# Patient Record
Sex: Male | Born: 1935 | Race: White | Hispanic: No | Marital: Married | Smoking: Never smoker
Health system: Southern US, Community
[De-identification: ages and names within clinical notes are randomized; demographics above are authoritative.]

## PROBLEM LIST (undated history)

## (undated) DIAGNOSIS — I10 Essential (primary) hypertension: Secondary | ICD-10-CM

## (undated) DIAGNOSIS — F329 Major depressive disorder, single episode, unspecified: Secondary | ICD-10-CM

## (undated) DIAGNOSIS — K529 Noninfective gastroenteritis and colitis, unspecified: Secondary | ICD-10-CM

## (undated) DIAGNOSIS — E785 Hyperlipidemia, unspecified: Secondary | ICD-10-CM

## (undated) DIAGNOSIS — I2510 Atherosclerotic heart disease of native coronary artery without angina pectoris: Secondary | ICD-10-CM

## (undated) DIAGNOSIS — N281 Cyst of kidney, acquired: Secondary | ICD-10-CM

## (undated) DIAGNOSIS — I2699 Other pulmonary embolism without acute cor pulmonale: Secondary | ICD-10-CM

## (undated) DIAGNOSIS — C61 Malignant neoplasm of prostate: Secondary | ICD-10-CM

## (undated) DIAGNOSIS — Z0000 Encounter for general adult medical examination without abnormal findings: Principal | ICD-10-CM

## (undated) DIAGNOSIS — F32A Depression, unspecified: Secondary | ICD-10-CM

## (undated) DIAGNOSIS — I639 Cerebral infarction, unspecified: Secondary | ICD-10-CM

## (undated) DIAGNOSIS — D649 Anemia, unspecified: Secondary | ICD-10-CM

## (undated) DIAGNOSIS — F0390 Unspecified dementia without behavioral disturbance: Secondary | ICD-10-CM

## (undated) HISTORY — DX: Noninfective gastroenteritis and colitis, unspecified: K52.9

## (undated) HISTORY — DX: Essential (primary) hypertension: I10

## (undated) HISTORY — PX: CORONARY ARTERY BYPASS GRAFT: SHX141

## (undated) HISTORY — PX: TONSILLECTOMY: SUR1361

## (undated) HISTORY — PX: EXTERNAL EAR SURGERY: SHX627

## (undated) HISTORY — DX: Other pulmonary embolism without acute cor pulmonale: I26.99

## (undated) HISTORY — DX: Major depressive disorder, single episode, unspecified: F32.9

## (undated) HISTORY — DX: Anemia, unspecified: D64.9

## (undated) HISTORY — DX: Cyst of kidney, acquired: N28.1

## (undated) HISTORY — DX: Atherosclerotic heart disease of native coronary artery without angina pectoris: I25.10

## (undated) HISTORY — DX: Cerebral infarction, unspecified: I63.9

## (undated) HISTORY — DX: Encounter for general adult medical examination without abnormal findings: Z00.00

## (undated) HISTORY — DX: Hyperlipidemia, unspecified: E78.5

## (undated) HISTORY — DX: Unspecified dementia, unspecified severity, without behavioral disturbance, psychotic disturbance, mood disturbance, and anxiety: F03.90

## (undated) HISTORY — DX: Depression, unspecified: F32.A

---

## 2012-08-01 ENCOUNTER — Telehealth: Payer: Self-pay | Admitting: Internal Medicine

## 2012-08-01 NOTE — Telephone Encounter (Signed)
Received medical records from [facility] ° °P: [phone] °F: [phone] °

## 2012-08-07 ENCOUNTER — Encounter: Payer: Self-pay | Admitting: Cardiology

## 2012-08-07 ENCOUNTER — Ambulatory Visit (INDEPENDENT_AMBULATORY_CARE_PROVIDER_SITE_OTHER): Payer: Medicare Other | Admitting: Cardiology

## 2012-08-07 VITALS — BP 140/80 | HR 62 | Wt 176.0 lb

## 2012-08-07 DIAGNOSIS — I10 Essential (primary) hypertension: Secondary | ICD-10-CM | POA: Insufficient documentation

## 2012-08-07 DIAGNOSIS — I25810 Atherosclerosis of coronary artery bypass graft(s) without angina pectoris: Secondary | ICD-10-CM

## 2012-08-07 DIAGNOSIS — R079 Chest pain, unspecified: Secondary | ICD-10-CM

## 2012-08-07 DIAGNOSIS — E785 Hyperlipidemia, unspecified: Secondary | ICD-10-CM | POA: Insufficient documentation

## 2012-08-07 NOTE — Assessment & Plan Note (Signed)
Continue statin. 

## 2012-08-07 NOTE — Progress Notes (Signed)
HPI: pleasant 78 year old male for evaluation of coronary disease. He is status post coronary artery bypassing graft in [place] in 1994. He has been followed there since that time. He recently moved to this area to be close to his daughter. He now presents to establish. He has dyspnea with more extreme activities but not routine activities. No orthopnea, PND or pedal edema. He has had rare brief chest pain in the past but does not have exertional chest pain. No syncope. No claudication.  Current Outpatient Prescriptions  Medication Sig Dispense Refill  . acetaminophen (TYLENOL EX ST ARTHRITIS PAIN) 500 MG tablet Take 500 mg by mouth every 6 (six) hours as needed.      . Ascorbic Acid (VITAMIN C PO) Take 1 tablet by mouth daily.      Marland Kitchen aspirin 81 MG tablet Take 81 mg by mouth daily.      . carvedilol (COREG) 6.25 MG tablet Take 6.25 mg by mouth 2 (two) times daily with a meal.      . Cholecalciferol (VITAMIN D) 400 UNITS capsule Take 400 Units by mouth daily.      . Cholecalciferol (VITAMIN D-3 PO) Take 1 tablet by mouth daily.      . Cyanocobalamin (VITAMIN B-12 CR PO) Take 1 tablet by mouth daily.      . finasteride (PROSCAR) 5 MG tablet Take 5 mg by mouth daily.      . isosorbide mononitrate (IMDUR) 30 MG 24 hr tablet Take 30 mg by mouth daily.      . memantine (NAMENDA) 10 MG tablet Take 10 mg by mouth 2 (two) times daily.      . Misc Natural Products (OSTEO BI-FLEX JOINT SHIELD PO) Take 1 tablet by mouth daily.      . Multiple Vitamin (MULTIVITAMIN) capsule Take 1 capsule by mouth daily.      . NON FORMULARY CALCIUM,MAG,VIT B6,VIT D  1 TAB PO QD      . Omega-3 Fatty Acids (FP FISH OIL PO) Take 1 tablet by mouth daily.      . rivastigmine (EXELON) 9.5 mg/24hr Place 1 patch onto the skin daily.      . simvastatin (ZOCOR) 40 MG tablet Take 40 mg by mouth every evening.      Marland Kitchen VITAMIN E PO Take 1 tablet by mouth daily.        No Known Allergies  Past Medical History  Diagnosis  Date  . Hypertension   . Hyperlipidemia   . CAD (coronary artery disease)   . Pulmonary embolus     Time of CABG  . Dementia   . Depression     Past Surgical History  Procedure Date  . Coronary artery bypass graft     [place]  . External ear surgery   . Tonsillectomy     History   Social History  . Marital Status: Married    Spouse Name: N/A    Number of Children: 1  . Years of Education: N/A   Occupational History  .      Retired   Social History Main Topics  . Smoking status: Never Smoker   . Smokeless tobacco: Not on file  . Alcohol Use: Not on file  . Drug Use: Not on file  . Sexually Active: Not on file   Other Topics Concern  . Not on file   Social History Narrative  . No narrative on file    Family History  Problem Relation Age of  Onset  . Coronary artery disease Father     MI in his 14s    ROS: problems with arthritis, decreased memory and deafness in right ear but no fevers or chills, productive cough, hemoptysis, dysphasia, odynophagia, melena, hematochezia, dysuria, hematuria, rash, seizure activity, orthopnea, PND, pedal edema, claudication. Remaining systems are negative.  Physical Exam:   Blood pressure 140/80, pulse 62, weight 176 lb (79.833 kg).  General:  Well developed/well nourished in NAD Skin warm/dry Patient not depressed No peripheral clubbing Back-normal HEENT-normal/normal eyelids Neck supple/normal carotid upstroke bilaterally; no bruits; no JVD; no thyromegaly chest - CTA/ normal expansion, previous sternotomy CV - RRR/normal S1 and S2; no rubs or gallops;  PMI nondisplaced, 1/6 systolic murmur left sternal border Abdomen -NT/ND, no HSM, no mass, + bowel sounds, no bruit 2+ femoral pulses, no bruits Ext-no edema, chords, 2+ DP Neuro-grossly nonfocal  ECG sinus rhythm at a rate of 62. First-degree AV block. Normal axis. No significant ST changes.

## 2012-08-07 NOTE — Assessment & Plan Note (Signed)
Blood pressure controlled. Continue present medications. 

## 2012-08-07 NOTE — Assessment & Plan Note (Signed)
Plan continue aspirin and statin. I will obtain all of his previous records from [place] to review.

## 2012-08-07 NOTE — Patient Instructions (Addendum)
Your physician wants you to follow-up in: 6 MONTHS WITH DR CRENSHAW IN [place] You will receive a reminder letter in the mail two months in advance. If you don't receive a letter, please call our office to schedule the follow-up appointment.  

## 2012-08-13 ENCOUNTER — Emergency Department (HOSPITAL_COMMUNITY): Payer: Medicare Other

## 2012-08-13 ENCOUNTER — Inpatient Hospital Stay (HOSPITAL_COMMUNITY): Payer: Medicare Other

## 2012-08-13 ENCOUNTER — Encounter (HOSPITAL_COMMUNITY): Payer: Self-pay | Admitting: *Deleted

## 2012-08-13 ENCOUNTER — Inpatient Hospital Stay (HOSPITAL_COMMUNITY)
Admission: EM | Admit: 2012-08-13 | Discharge: 2012-08-15 | DRG: 066 | Disposition: A | Discharge status: Home Health | Payer: Medicare Other | Attending: Internal Medicine | Admitting: Internal Medicine

## 2012-08-13 DIAGNOSIS — Z951 Presence of aortocoronary bypass graft: Secondary | ICD-10-CM | POA: Diagnosis present

## 2012-08-13 DIAGNOSIS — I6350 Cerebral infarction due to unspecified occlusion or stenosis of unspecified cerebral artery: Principal | ICD-10-CM

## 2012-08-13 DIAGNOSIS — F3289 Other specified depressive episodes: Secondary | ICD-10-CM | POA: Diagnosis present

## 2012-08-13 DIAGNOSIS — I639 Cerebral infarction, unspecified: Secondary | ICD-10-CM | POA: Diagnosis present

## 2012-08-13 DIAGNOSIS — E785 Hyperlipidemia, unspecified: Secondary | ICD-10-CM | POA: Diagnosis present

## 2012-08-13 DIAGNOSIS — Z7982 Long term (current) use of aspirin: Secondary | ICD-10-CM

## 2012-08-13 DIAGNOSIS — R4701 Aphasia: Secondary | ICD-10-CM | POA: Diagnosis present

## 2012-08-13 DIAGNOSIS — Z86711 Personal history of pulmonary embolism: Secondary | ICD-10-CM

## 2012-08-13 DIAGNOSIS — R4182 Altered mental status, unspecified: Secondary | ICD-10-CM

## 2012-08-13 DIAGNOSIS — I10 Essential (primary) hypertension: Secondary | ICD-10-CM

## 2012-08-13 DIAGNOSIS — I379 Nonrheumatic pulmonary valve disorder, unspecified: Secondary | ICD-10-CM

## 2012-08-13 DIAGNOSIS — Z79899 Other long term (current) drug therapy: Secondary | ICD-10-CM

## 2012-08-13 DIAGNOSIS — I2510 Atherosclerotic heart disease of native coronary artery without angina pectoris: Secondary | ICD-10-CM | POA: Diagnosis present

## 2012-08-13 DIAGNOSIS — F0390 Unspecified dementia without behavioral disturbance: Secondary | ICD-10-CM

## 2012-08-13 DIAGNOSIS — I25810 Atherosclerosis of coronary artery bypass graft(s) without angina pectoris: Secondary | ICD-10-CM

## 2012-08-13 DIAGNOSIS — F329 Major depressive disorder, single episode, unspecified: Secondary | ICD-10-CM | POA: Insufficient documentation

## 2012-08-13 LAB — POCT I-STAT TROPONIN I: Troponin i, poc: 0 ng/mL (ref 0.00–0.08)

## 2012-08-13 LAB — CBC
HCT: 36.4 % — ABNORMAL LOW (ref 39.0–52.0)
MCH: 31.4 pg (ref 26.0–34.0)
MCHC: 34.6 g/dL (ref 30.0–36.0)
MCV: 90.8 fL (ref 78.0–100.0)
Platelets: 196 10*3/uL (ref 150–400)
RDW: 12.4 % (ref 11.5–15.5)
WBC: 5.8 10*3/uL (ref 4.0–10.5)

## 2012-08-13 LAB — CBC WITH DIFFERENTIAL/PLATELET
HCT: 36.6 % — ABNORMAL LOW (ref 39.0–52.0)
Hemoglobin: 12.6 g/dL — ABNORMAL LOW (ref 13.0–17.0)
Lymphocytes Relative: 25 % (ref 12–46)
Lymphs Abs: 1.4 10*3/uL (ref 0.7–4.0)
Monocytes Relative: 8 % (ref 3–12)
Neutro Abs: 3.6 10*3/uL (ref 1.7–7.7)
Neutrophils Relative %: 64 % (ref 43–77)
RBC: 4.01 MIL/uL — ABNORMAL LOW (ref 4.22–5.81)

## 2012-08-13 LAB — URINALYSIS, ROUTINE W REFLEX MICROSCOPIC
Glucose, UA: NEGATIVE mg/dL
Ketones, ur: NEGATIVE mg/dL
Leukocytes, UA: NEGATIVE
Protein, ur: NEGATIVE mg/dL

## 2012-08-13 LAB — COMPREHENSIVE METABOLIC PANEL
Albumin: 4 g/dL (ref 3.5–5.2)
Alkaline Phosphatase: 52 U/L (ref 39–117)
BUN: 11 mg/dL (ref 6–23)
CO2: 31 mEq/L (ref 19–32)
Chloride: 104 mEq/L (ref 96–112)
GFR calc Af Amer: >90 mL/min (ref >90)
GFR calc non Af Amer: 85 mL/min — ABNORMAL LOW (ref >90)
Glucose, Bld: 106 mg/dL — ABNORMAL HIGH (ref 70–99)
Potassium: 4.4 mEq/L (ref 3.5–5.1)
Total Bilirubin: 0.4 mg/dL (ref 0.3–1.2)

## 2012-08-13 LAB — CREATININE, SERUM: GFR calc Af Amer: >90 mL/min (ref >90)

## 2012-08-13 MED ORDER — ASPIRIN 325 MG PO TABS
325.0000 mg | ORAL_TABLET | Freq: Every day | ORAL | Status: DC
  Administered 2012-08-14: 325 mg via ORAL
  Filled 2012-08-13 (×2): qty 1

## 2012-08-13 MED ORDER — CLOPIDOGREL BISULFATE 75 MG PO TABS: 75.0000 mg | ORAL_TABLET | Freq: Every day | ORAL | Status: DC

## 2012-08-13 MED ORDER — ONDANSETRON HCL 4 MG/2ML IJ SOLN: 4.0000 mg | Freq: Four times a day (QID) | INTRAMUSCULAR | Status: DC | PRN

## 2012-08-13 MED ORDER — SENNOSIDES-DOCUSATE SODIUM 8.6-50 MG PO TABS: 1.0000 | ORAL_TABLET | Freq: Every evening | ORAL | Status: DC | PRN

## 2012-08-13 MED ORDER — SIMVASTATIN 40 MG PO TABS
40.0000 mg | ORAL_TABLET | Freq: Every evening | ORAL | Status: DC
  Administered 2012-08-14 – 2012-08-15 (×2): 40 mg via ORAL
  Filled 2012-08-13 (×3): qty 1

## 2012-08-13 MED ORDER — ADULT MULTIVITAMIN W/MINERALS CH
1.0000 | ORAL_TABLET | Freq: Every day | ORAL | Status: DC
  Administered 2012-08-14 – 2012-08-15 (×2): 1 via ORAL
  Filled 2012-08-13 (×3): qty 1

## 2012-08-13 MED ORDER — MEMANTINE HCL 10 MG PO TABS
10.0000 mg | ORAL_TABLET | Freq: Two times a day (BID) | ORAL | Status: DC
  Administered 2012-08-14 – 2012-08-15 (×3): 10 mg via ORAL
  Filled 2012-08-13 (×5): qty 1

## 2012-08-13 MED ORDER — RIVASTIGMINE 9.5 MG/24HR TD PT24
9.5000 mg | MEDICATED_PATCH | Freq: Every day | TRANSDERMAL | Status: DC
  Administered 2012-08-13 – 2012-08-15 (×3): 9.5 mg via TRANSDERMAL
  Filled 2012-08-13 (×3): qty 1

## 2012-08-13 MED ORDER — LABETALOL HCL 5 MG/ML IV SOLN: 10.0000 mg | Freq: Four times a day (QID) | INTRAVENOUS | Status: DC | PRN

## 2012-08-13 MED ORDER — ASPIRIN 300 MG RE SUPP
300.0000 mg | Freq: Every day | RECTAL | Status: DC
  Administered 2012-08-13: 300 mg via RECTAL
  Filled 2012-08-13 (×3): qty 1

## 2012-08-13 MED ORDER — ENOXAPARIN SODIUM 40 MG/0.4ML ~~LOC~~ SOLN
40.0000 mg | SUBCUTANEOUS | Status: DC
  Administered 2012-08-13 – 2012-08-14 (×2): 40 mg via SUBCUTANEOUS
  Filled 2012-08-13 (×3): qty 0.4

## 2012-08-13 MED ORDER — SODIUM CHLORIDE 0.9 % IV SOLN
INTRAVENOUS | Status: DC
  Administered 2012-08-13: 20:00:00 via INTRAVENOUS

## 2012-08-13 MED ORDER — FINASTERIDE 5 MG PO TABS
5.0000 mg | ORAL_TABLET | Freq: Every day | ORAL | Status: DC
  Administered 2012-08-14 – 2012-08-15 (×2): 5 mg via ORAL
  Filled 2012-08-13 (×3): qty 1

## 2012-08-13 MED ORDER — MULTIVITAMINS PO CAPS: 1.0000 | ORAL_CAPSULE | Freq: Every day | ORAL | Status: DC

## 2012-08-13 NOTE — ED Notes (Signed)
Patient lives at independent living facility and started having periods of confusion and expressive aphasia yesterday.  He has left sided facial drooping and weakness per EMS.

## 2012-08-13 NOTE — Progress Notes (Signed)
VASCULAR LAB PRELIMINARY  PRELIMINARY  PRELIMINARY  PRELIMINARY  Carotid duplex completed.    Preliminary report:  Bilateral:  No evidence of hemodynamically significant internal carotid artery stenosis.   Vertebral artery flow is antegrade.     Shanise Balch, RVS 08/13/2012, 3:47 PM

## 2012-08-13 NOTE — ED Provider Notes (Addendum)
History     CSN: [id number]  Arrival date & time 08/13/12  1128   First MD Initiated Contact with Patient 08/13/12 1150      Chief Complaint  Patient presents with  . Stroke Symptoms    (Consider location/radiation/quality/duration/timing/severity/associated sxs/prior treatment) HPI Pt p/w difficult expressing words and confusion starting yesterday per caretaker. Thought to have left facial drooping by EMS. Pt denies HA, CP, SOB, Abd pain, N/V. Has history of dementia.  Past Medical History  Diagnosis Date  . Hypertension   . Hyperlipidemia   . CAD (coronary artery disease)   . Pulmonary embolus     Time of CABG  . Dementia   . Depression     Past Surgical History  Procedure Date  . Coronary artery bypass graft     [place]  . External ear surgery   . Tonsillectomy     Family History  Problem Relation Age of Onset  . Coronary artery disease Father     MI in his 61s    History  Substance Use Topics  . Smoking status: Never Smoker   . Smokeless tobacco: Not on file  . Alcohol Use: No      Review of Systems  Respiratory: Negative for shortness of breath.   Cardiovascular: Negative for chest pain.  Gastrointestinal: Negative for nausea, vomiting and abdominal pain.  Neurological: Positive for speech difficulty. Negative for headaches.    Allergies  Niacin and related and Wellbutrin  Home Medications   Current Outpatient Rx  Name Route Sig Dispense Refill  . ACETAMINOPHEN ER 650 MG PO TBCR Oral Take 1,300 mg by mouth daily.    . ASPIRIN 325 MG PO TABS Oral Take 325 mg by mouth daily.    Marland Kitchen CARVEDILOL 6.25 MG PO TABS Oral Take 6.25 mg by mouth 2 (two) times daily with a meal.    . FINASTERIDE 5 MG PO TABS Oral Take 5 mg by mouth daily.    . ISOSORBIDE MONONITRATE ER 30 MG PO TB24 Oral Take 30 mg by mouth daily.    Marland Kitchen MEMANTINE HCL 10 MG PO TABS Oral Take 10 mg by mouth 2 (two) times daily.    Lanetta Inch BI-FLEX JOINT SHIELD PO Oral Take 2 tablets by  mouth daily.     . MULTIVITAMINS PO CAPS Oral Take 1 capsule by mouth daily.    . FP FISH OIL PO Oral Take 2,400 mg by mouth daily.     Marland Kitchen RIVASTIGMINE 9.5 MG/24HR TD PT24 Transdermal Place 1 patch onto the skin daily.    Marland Kitchen SIMVASTATIN 40 MG PO TABS Oral Take 40 mg by mouth every evening.      BP 154/81  Pulse 58  Temp 98.3 F (36.8 C) (Oral)  Resp 20  SpO2 99%  Physical Exam  Nursing note and vitals reviewed. Constitutional: He appears well-developed and well-nourished. No distress.  HENT:  Head: Normocephalic and atraumatic.  Mouth/Throat: Oropharynx is clear and moist.  Eyes: EOM are normal. Pupils are equal, round, and reactive to light.  Neck: Normal range of motion. Neck supple.  Cardiovascular: Normal rate and regular rhythm.   Pulmonary/Chest: Effort normal and breath sounds normal. No respiratory distress. He has no wheezes. He has no rales.  Abdominal: Soft. Bowel sounds are normal. There is no tenderness. There is no rebound and no guarding.  Musculoskeletal: Normal range of motion. He exhibits no edema and no tenderness.  Neurological: He is alert.  Oriented x 2, 5/5 grip strength bl upper ext. 5/5 bl LE motor, sensation intact, finger to nose intact. Pt has difficulty finishing thoughts before jumping to next sentence. Difficult to understand  Skin: Skin is warm and dry. No rash noted. No erythema.  Psychiatric: He has a normal mood and affect. His behavior is normal.    ED Course  Procedures (including critical care time)  Labs Reviewed  CBC WITH DIFFERENTIAL - Abnormal; Notable for the following:    RBC 4.01 (*)     Hemoglobin 12.6 (*)     HCT 36.6 (*)     All other components within normal limits  COMPREHENSIVE METABOLIC PANEL - Abnormal; Notable for the following:    Glucose, Bld 106 (*)     GFR calc non Af Amer 85 (*)     All other components within normal limits  URINALYSIS, ROUTINE W REFLEX MICROSCOPIC  PROTIME-INR  APTT  POCT I-STAT TROPONIN I    HEMOGLOBIN A1C  LIPID PANEL   Ct Head Wo Contrast  08/13/2012  *RADIOLOGY REPORT*  Clinical Data: Confusion.  CT HEAD WITHOUT CONTRAST  Technique:  Contiguous axial images were obtained from the base of the skull through the vertex without contrast.  Comparison: None.  Findings: There is atrophy and chronic small vessel disease changes.  Physiologic calcifications in the basal ganglia. No acute intracranial abnormality.  Specifically, no hemorrhage, hydrocephalus, mass lesion, acute infarction, or significant intracranial injury.  No acute calvarial abnormality. Visualized paranasal sinuses and mastoids clear.  Orbital soft tissues unremarkable.  Prior old right mastoidectomy.  IMPRESSION: No acute intracranial abnormality.  Atrophy, chronic microvascular disease.   Original Report Authenticated By: Cyndie Chime, M.D.      1. Altered mental status      Date: 08/13/2012  Rate: 57  Rhythm: sinus bradycardia  QRS Axis: normal  Intervals: normal  ST/T Wave abnormalities: normal  Conduction Disutrbances:none  Narrative Interpretation:   Old EKG Reviewed: none available    MDM  Seen by neuro. Think pt likely suffered CVA. Admit for stroke workup. [facility] to admit        Loren Racer, MD 08/13/12 1444  Loren Racer, MD 08/13/12 1450

## 2012-08-13 NOTE — Consult Note (Signed)
[facility] CONSULT NOTE     Reason for Consult: word finding difficulty    HPI:    Samuel Meyers is an 78 y.o. male who lives at a assisted living center and recently moved to [facility].  Patient at baseline has neuro degenerative decline but not formally diagnosed with dementia.  He is on the Exelon patch and Nemenda.  He also has speech difficulty at baseline and works with SLP at the home.  Yesterday it was noted he was having difficulty expressing himself and having paraphasic errors. He was brought to [facility] for further evaluation.   No tPA given -out of window  Past Medical History  Diagnosis Date  . Hypertension   . Hyperlipidemia   . CAD (coronary artery disease)   . Pulmonary embolus     Time of CABG  . Dementia   . Depression     Past Surgical History  Procedure Date  . Coronary artery bypass graft     [place]  . External ear surgery   . Tonsillectomy     Family History  Problem Relation Age of Onset  . Coronary artery disease Father     MI in his 49s    Social History:  reports that he has never smoked. He does not have any smokeless tobacco history on file. He reports that he does not drink alcohol or use illicit drugs.  Allergies  Allergen Reactions  . Niacin And Related Nausea And Vomiting  . Wellbutrin (Bupropion) Hives    Gi upset    Medications:    No current facility-administered medications for this encounter.   Current Outpatient Prescriptions  Medication Sig Dispense Refill  . acetaminophen (TYLENOL) 650 MG CR tablet Take 1,300 mg by mouth daily.      Marland Kitchen aspirin 325 MG tablet Take 325 mg by mouth daily.      . carvedilol (COREG) 6.25 MG tablet Take 6.25 mg by mouth 2 (two) times daily with a meal.      . finasteride (PROSCAR) 5 MG tablet Take 5 mg by mouth daily.      . isosorbide mononitrate (IMDUR) 30 MG 24 hr tablet Take 30 mg by mouth daily.      . memantine (NAMENDA) 10 MG tablet Take 10 mg  by mouth 2 (two) times daily.      . Misc Natural Products (OSTEO BI-FLEX JOINT SHIELD PO) Take 2 tablets by mouth daily.       . Multiple Vitamin (MULTIVITAMIN) capsule Take 1 capsule by mouth daily.      . Omega-3 Fatty Acids (FP FISH OIL PO) Take 2,400 mg by mouth daily.       . rivastigmine (EXELON) 9.5 mg/24hr Place 1 patch onto the skin daily.      . simvastatin (ZOCOR) 40 MG tablet Take 40 mg by mouth every evening.         Review of Systems - General ROS: negative for - chills, fatigue, fever or hot flashes Hematological and Lymphatic ROS: negative for - bruising, fatigue, jaundice or pallor Endocrine ROS: negative for - hair pattern changes, hot flashes, mood swings or skin changes Respiratory ROS: negative for - cough, hemoptysis, orthopnea or wheezing Cardiovascular ROS: negative for - dyspnea on exertion, orthopnea, palpitations or shortness of breath Gastrointestinal ROS: negative for - abdominal pain, appetite loss, blood in stools, diarrhea  or hematemesis Musculoskeletal ROS: negative for - joint pain, joint stiffness, joint swelling or muscle pain Neurological ROS: positive for - confusion and speech problems Dermatological ROS: negative for dry skin, pruritus and rash   Blood pressure 154/81, pulse 58, temperature 98.3 F (36.8 C), temperature source Oral, resp. rate 20, SpO2 99.00%.   Neurologic Examination:   Mental Status: Alert, oriented to place, year and president.  Speech fluent with difficulty word finding, expressing his thoughts and showing paraphasic errors. Able to follow 3 step commands without difficulty.  Cranial Nerves: II-Visual fields grossly intact. III/IV/VI-Extraocular movements intact.  Pupils reactive bilaterally. Ptosis not present. V/VII-Smile symmetric VIII-grossly intact IX/X-normal gag XI-bilateral shoulder shrug XII-midline tongue extension Motor: 5/5 bilaterally with normal tone and bulk--positive right arm drift. No grasp reflex,  positive palmomental Sensory: Pinprick and light touch intact throughout, bilaterally Deep Tendon Reflexes:  Right: Upper Extremity   Left: Upper extremity   biceps (C-5 to C-6) 2/4   biceps (C-5 to C-6) 2/4 tricep (C7) 2/4    triceps (C7) 2/4 Brachioradialis (C6) 2/4  Brachioradialis (C6) 2/4  Lower Extremity Lower Extremity  quadriceps (L-2 to L-4) 2/4   quadriceps (L-2 to L-4) 2/4 Achilles (S1) 2/4   Achilles (S1) 2/4      Plantars:      Right:  downgoing     Left:  Downgoing Cerebellar: Normal finger-to-nose,  normal heel-to-shin test.     No results found for this basename: cbc, bmp, coags, chol, tri, ldl, hga1c    Results for orders placed during the hospital encounter of 08/13/12 (from the past 48 hour(s))  CBC WITH DIFFERENTIAL     Status: Abnormal   Collection Time   08/13/12 12:05 PM      Component Value Range Comment   WBC 5.7  4.0 - 10.5 K/uL    RBC 4.01 (*) 4.22 - 5.81 MIL/uL    Hemoglobin 12.6 (*) 13.0 - 17.0 g/dL    HCT 16.1 (*) 09.6 - 52.0 %    MCV 91.3  78.0 - 100.0 fL    MCH 31.4  26.0 - 34.0 pg    MCHC 34.4  30.0 - 36.0 g/dL    RDW 04.5  40.9 - 81.1 %    Platelets 197  150 - 400 K/uL    Neutrophils Relative 64  43 - 77 %    Neutro Abs 3.6  1.7 - 7.7 K/uL    Lymphocytes Relative 25  12 - 46 %    Lymphs Abs 1.4  0.7 - 4.0 K/uL    Monocytes Relative 8  3 - 12 %    Monocytes Absolute 0.4  0.1 - 1.0 K/uL    Eosinophils Relative 3  0 - 5 %    Eosinophils Absolute 0.2  0.0 - 0.7 K/uL    Basophils Relative 1  0 - 1 %    Basophils Absolute 0.0  0.0 - 0.1 K/uL   COMPREHENSIVE METABOLIC PANEL     Status: Abnormal   Collection Time   08/13/12 12:05 PM      Component Value Range Comment   Sodium 141  135 - 145 mEq/L    Potassium 4.4  3.5 - 5.1 mEq/L    Chloride 104  96 - 112 mEq/L    CO2 31  19 - 32 mEq/L    Glucose, Bld 106 (*) 70 - 99 mg/dL    BUN 11  6 - 23 mg/dL    Creatinine, Ser 9.14  0.50 -  1.35 mg/dL    Calcium 9.7  8.4 - 86.5 mg/dL    Total Protein  7.0  6.0 - 8.3 g/dL    Albumin 4.0  3.5 - 5.2 g/dL    AST 27  0 - 37 U/L    ALT 22  0 - 53 U/L    Alkaline Phosphatase 52  39 - 117 U/L    Total Bilirubin 0.4  0.3 - 1.2 mg/dL    GFR calc non Af Amer 85 (*) >90 mL/min    GFR calc Af Amer >90  >90 mL/min   PROTIME-INR     Status: Normal   Collection Time   08/13/12 12:05 PM      Component Value Range Comment   Prothrombin Time 14.5  11.6 - 15.2 seconds    INR 1.11  0.00 - 1.49   APTT     Status: Normal   Collection Time   08/13/12 12:05 PM      Component Value Range Comment   aPTT 31  24 - 37 seconds   URINALYSIS, ROUTINE W REFLEX MICROSCOPIC     Status: Normal   Collection Time   08/13/12 12:09 PM      Component Value Range Comment   Color, Urine YELLOW  YELLOW    APPearance CLEAR  CLEAR    Specific Gravity, Urine 1.009  1.005 - 1.030    pH 7.0  5.0 - 8.0    Glucose, UA NEGATIVE  NEGATIVE mg/dL    Hgb urine dipstick NEGATIVE  NEGATIVE    Bilirubin Urine NEGATIVE  NEGATIVE    Ketones, ur NEGATIVE  NEGATIVE mg/dL    Protein, ur NEGATIVE  NEGATIVE mg/dL    Urobilinogen, UA 0.2  0.0 - 1.0 mg/dL    Nitrite NEGATIVE  NEGATIVE    Leukocytes, UA NEGATIVE  NEGATIVE MICROSCOPIC NOT DONE ON URINES WITH NEGATIVE PROTEIN, BLOOD, LEUKOCYTES, NITRITE, OR GLUCOSE <1000 mg/dL.  POCT I-STAT TROPONIN I     Status: Normal   Collection Time   08/13/12 12:25 PM      Component Value Range Comment   Troponin i, poc 0.00  0.00 - 0.08 ng/mL    Comment 3              Ct Head Wo Contrast  08/13/2012  *RADIOLOGY REPORT*  Clinical Data: Confusion.  CT HEAD WITHOUT CONTRAST  Technique:  Contiguous axial images were obtained from the base of the skull through the vertex without contrast.  Comparison: None.  Findings: There is atrophy and chronic small vessel disease changes.  Physiologic calcifications in the basal ganglia. No acute intracranial abnormality.  Specifically, no hemorrhage, hydrocephalus, mass lesion, acute infarction, or significant  intracranial injury.  No acute calvarial abnormality. Visualized paranasal sinuses and mastoids clear.  Orbital soft tissues unremarkable.  Prior old right mastoidectomy.  IMPRESSION: No acute intracranial abnormality.  Atrophy, chronic microvascular disease.   Original Report Authenticated By: Cyndie Chime, M.D.      Assessment/Plan:   78 YO male with HTN, Hyperlipidemia, CAD S/P CABG and greenfield filter who presents to ED after family noted increased difficulty expressing himself and increased word substitution. Given exam findings of right arm drift and languadge difficulties patient likely has suffered a left cortical infarct. tPA not given due to delay in arrival.   Recommend:   Plan: 1. HgbA1c, fasting lipid panel 2. MRI, MRA  of the brain without contrast 3. PT consult, OT consult, Speech consult 4. Echocardiogram 5. Carotid dopplers  6. Prophylactic therapy-Antiplatelet med: Plavix - dose 75 mg daily 7. Risk  Factor modification  Felicie Morn PA-C [facility] [phone]  08/13/2012, 2:13 PM  I have seen and evaluated the patient. I have reviewed the above note and made appropriate changes.   Ritta Slot, MD [facility] [phone]

## 2012-08-13 NOTE — Progress Notes (Signed)
Disposition Note  Delrick Dehart, is a 78 y.o. male,   MRN: [id number]  -  DOB - 01-03-1934  Outpatient Primary MD for the patient is Letitia Libra, Ala Dach, MD   Blood pressure 154/81, pulse 58, temperature 98.3 F (36.8 C), temperature source Oral, resp. rate 20, SpO2 99.00%.  Active Problems:  Altered mental status  Dementia  CAD (coronary artery disease) of artery bypass graft  Hyperlipidemia  Hypertension   78 yo male who lives in ALF was found with altered mental status by his helper who comes to check on him.  Per the EDP the patient is unable to give his own history, but per the helper the patient is unable to complete thoughts and is having more difficulty with aphasia and word finding that usual. (He works with speech therapy at home).  Neuro was consulted and Felicie Morn, PA felt that the patient had a left upper extremity drift.  Consequently it was felt appropriate to admit for stroke work up.  I have requested a telemetry bed on the neuro unit.   Algis Downs, PA-C [facility] Pager: [phone]

## 2012-08-13 NOTE — ED Notes (Signed)
Neuro MD at bedside

## 2012-08-13 NOTE — H&P (Signed)
PATIENT DETAILS Name: Samuel Meyers Age: 78 y.o. Sex: male Date of Birth: [birth year] Admit Date: 08/13/2012 [id number] [name], Ala Dach, MD   CHIEF COMPLAINT:  Speech difficulties since yesterday afternoon  HPI: Patient is a 78 year old Caucasian male with a past medical history of hypertension, coronary disease status post CABG in the 1990s, dementia who presents to the hospital for evaluation of speech difficulty. Patient lives in assisted living facility, he recently moved to [facility] to be close to his door, at baseline he does have some amount of speech difficulty, he was scheduled to see a speech therapist today-who found him with more speech difficulty than usual-apparently patient was having difficulty finding words and expressing himself, so he was referred to the ED for further evaluation. Family is present at bedside, per them this started sometime yesterday afternoon. There is no history of headache, nausea vomiting. There is no history of chest pain or shortness of breath. There is a history of abdominal pain. There is a history of vomiting or diarrhea.   ALLERGIES:   Allergies  Allergen Reactions  . Niacin And Related Nausea And Vomiting  . Wellbutrin (Bupropion) Hives    Gi upset    PAST MEDICAL HISTORY: Past Medical History  Diagnosis Date  . Hypertension   . Hyperlipidemia   . CAD (coronary artery disease)   . Pulmonary embolus     Time of CABG  . Dementia   . Depression     PAST SURGICAL HISTORY: Past Surgical History  Procedure Date  . Coronary artery bypass graft     [place]  . External ear surgery   . Tonsillectomy     MEDICATIONS AT HOME: Prior to Admission medications   Medication Sig Start Date End Date Taking? Authorizing Provider  acetaminophen (TYLENOL) 650 MG CR tablet Take 1,300 mg by mouth daily.   Yes Historical Provider, MD  aspirin 325 MG tablet Take 325 mg by mouth daily.   Yes Historical Provider, MD  carvedilol (COREG)  6.25 MG tablet Take 6.25 mg by mouth 2 (two) times daily with a meal.   Yes Historical Provider, MD  finasteride (PROSCAR) 5 MG tablet Take 5 mg by mouth daily.   Yes Historical Provider, MD  isosorbide mononitrate (IMDUR) 30 MG 24 hr tablet Take 30 mg by mouth daily.   Yes Historical Provider, MD  memantine (NAMENDA) 10 MG tablet Take 10 mg by mouth 2 (two) times daily.   Yes Historical Provider, MD  Misc Natural Products (OSTEO BI-FLEX JOINT SHIELD PO) Take 2 tablets by mouth daily.    Yes Historical Provider, MD  Multiple Vitamin (MULTIVITAMIN) capsule Take 1 capsule by mouth daily.   Yes Historical Provider, MD  Omega-3 Fatty Acids (FP FISH OIL PO) Take 2,400 mg by mouth daily.    Yes Historical Provider, MD  rivastigmine (EXELON) 9.5 mg/24hr Place 1 patch onto the skin daily.   Yes Historical Provider, MD  simvastatin (ZOCOR) 40 MG tablet Take 40 mg by mouth every evening.   Yes Historical Provider, MD    FAMILY HISTORY: Family History  Problem Relation Age of Onset  . Coronary artery disease Father     MI in his 82s    SOCIAL HISTORY:  reports that he has never smoked. He does not have any smokeless tobacco history on file. He reports that he does not drink alcohol or use illicit drugs.  REVIEW OF SYSTEMS:  Constitutional:   No  weight loss, night sweats,  Fevers, chills,  fatigue.  HEENT:    No headaches, Difficulty swallowing,Tooth/dental problems,Sore throat,  No sneezing, itching, ear ache, nasal congestion, post nasal drip,   Cardio-vascular: No chest pain,  Orthopnea, PND, swelling in lower extremities, anasarca, dizziness, palpitations  GI:  No heartburn, indigestion, abdominal pain, nausea, vomiting, diarrhea, change in bowel habits, loss of appetite  Resp: No shortness of breath with exertion or at rest.  No excess mucus, no productive cough, No non-productive cough,  No coughing up of blood.No change in color of mucus.No wheezing.No chest wall deformity  Skin:    no rash or lesions.  GU:  no dysuria, change in color of urine, no urgency or frequency.  No flank pain.  Musculoskeletal: No joint pain or swelling.  No decreased range of motion.  No back pain.  Psych: No change in mood or affect. No depression or anxiety.  No memory loss.   PHYSICAL EXAM: Blood pressure 181/121, pulse 58, temperature 98.3 F (36.8 C), temperature source Oral, resp. rate 18, SpO2 98.00%.  General appearance :Awake, mostly alert, not in any distress. Speech not very fluent-has obvious difficulty and wasn't expressing himself.  Not toxic Looking HEENT: Atraumatic and Normocephalic, pupils equally reactive to light and accomodation Neck: supple, no JVD. No cervical lymphadenopathy.  Chest:Good air entry bilaterally, no added sounds  CVS: S1 S2 regular, no murmurs.  Abdomen: Bowel sounds present, Non tender and not distended with no gaurding, rigidity or rebound. Extremities: B/L Lower Ext shows no edema, both legs are warm to touch, with  dorsalis pedis pulses palpable. Neurology: Awake alert, and oriented X 3, CN II-XII intact-except for slight right facial droop., Non focal-with grossly 5 out of 5 strength,sensory exam is grossly intact.  Skin:No Rash Wounds:N/A  LABS ON ADMISSION:   Basename 08/13/12 1205  NA 141  K 4.4  CL 104  CO2 31  GLUCOSE 106*  BUN 11  CREATININE 0.80  CALCIUM 9.7  MG --  PHOS --    Basename 08/13/12 1205  AST 27  ALT 22  ALKPHOS 52  BILITOT 0.4  PROT 7.0  ALBUMIN 4.0   No results found for this basename: LIPASE:2,AMYLASE:2 in the last 72 hours  Basename 08/13/12 1205  WBC 5.7  NEUTROABS 3.6  HGB 12.6*  HCT 36.6*  MCV 91.3  PLT 197   No results found for this basename: CKTOTAL:3,CKMB:3,CKMBINDEX:3,TROPONINI:3 in the last 72 hours No results found for this basename: DDIMER:2 in the last 72 hours No components found with this basename: POCBNP:3   RADIOLOGIC STUDIES ON ADMISSION: Ct Head Wo  Contrast  08/13/2012  *RADIOLOGY REPORT*  Clinical Data: Confusion.  CT HEAD WITHOUT CONTRAST  Technique:  Contiguous axial images were obtained from the base of the skull through the vertex without contrast.  Comparison: None.  Findings: There is atrophy and chronic small vessel disease changes.  Physiologic calcifications in the basal ganglia. No acute intracranial abnormality.  Specifically, no hemorrhage, hydrocephalus, mass lesion, acute infarction, or significant intracranial injury.  No acute calvarial abnormality. Visualized paranasal sinuses and mastoids clear.  Orbital soft tissues unremarkable.  Prior old right mastoidectomy.  IMPRESSION: No acute intracranial abnormality.  Atrophy, chronic microvascular disease.   Original Report Authenticated By: Cyndie Chime, M.D.     ASSESSMENT AND PLAN: Present on Admission:  .CVA (cerebral infarction)-subacute -Admit to telemetry  -Per RN-patient failed a bedside swallow screen, hence will need a formal speech therapy evaluation. He is already on aspirin, he would likely need to be switched to  oral Plavix. However since he failed a bedside swallow screen he will be n.p.o., so for the time being he will be placed on aspirin currently.  -MRI brain has already been done-results pending  -Will order MRA brain, carotid Doppler and a 2-D echocardiogram  -Continue statin when oral intake resumed.  -Follow neurology recommendation -Need aggressive risk factor modification  .CAD (coronary artery disease) of artery bypass graft -Continue aspirin for now  -Resume Coreg and statin when oral intake resumed.   .Hyperlipidemia -Check lipid panel  -Continue statin when oral intake resumed   .Dementia -Seems to be at baseline -Resume oral medications when oral intake resumed  .Hypertension -Allow for some permissive hypertension, as needed labetalol.   Further plan will depend as patient's clinical course evolves and further radiologic and laboratory  data become available. Patient will be monitored closely.  DVT Prophylaxis: -Prophylactic Lovenox  Code Status: -Full code  Total time spent for admission equals 45 minutes.  [facility] Pager [phone]  If 7PM-7AM, please contact night-coverage [email or web address] Password [facility] 08/13/2012, 4:48 PM

## 2012-08-13 NOTE — ED Notes (Signed)
Per wife and daughter pt has history of dementia like symptoms but has never been diagnosed with dementia.  Pt was normal yesterday until 8pm wife noticed his speech was slurred and wasn't making sense.  Wife did not report because of previous confusion issues.  Today pt was attempting to read and wasn't able to comprehend the words he was reading.  Pt speech is slurred and not able to use the words he wants to use.  Pt appears to understand spoken language and follows commands appropriately.  Pt alert and oriented to person place time and circumstance.  Pt believes he is better than he was yesterday but family disagrees.  Family states the symptoms dont seem to be getting better or worse

## 2012-08-13 NOTE — Progress Notes (Signed)
  Echocardiogram 2D Echocardiogram has been performed.  Cathie Beams 08/13/2012, 3:59 PM

## 2012-08-14 DIAGNOSIS — I10 Essential (primary) hypertension: Secondary | ICD-10-CM

## 2012-08-14 DIAGNOSIS — I2510 Atherosclerotic heart disease of native coronary artery without angina pectoris: Secondary | ICD-10-CM

## 2012-08-14 LAB — HEMOGLOBIN A1C
Hgb A1c MFr Bld: 5.7 % — ABNORMAL HIGH (ref <5.7)
Mean Plasma Glucose: 123 mg/dL — ABNORMAL HIGH (ref <117)
Mean Plasma Glucose: 117 mg/dL — ABNORMAL HIGH (ref <117)

## 2012-08-14 LAB — GLUCOSE, CAPILLARY
Glucose-Capillary: 84 mg/dL (ref 70–99)
Glucose-Capillary: 86 mg/dL (ref 70–99)
Glucose-Capillary: 94 mg/dL (ref 70–99)
Glucose-Capillary: 126 mg/dL — ABNORMAL HIGH (ref 70–99)
Glucose-Capillary: 108 mg/dL — ABNORMAL HIGH (ref 70–99)

## 2012-08-14 LAB — LIPID PANEL
Cholesterol: 118 mg/dL (ref 0–200)
HDL: 48 mg/dL (ref >39)
Total CHOL/HDL Ratio: 2.5 RATIO
Triglycerides: 106 mg/dL (ref <150)

## 2012-08-14 MED ORDER — CARVEDILOL 6.25 MG PO TABS
6.2500 mg | ORAL_TABLET | Freq: Two times a day (BID) | ORAL | Status: DC
  Filled 2012-08-14 (×2): qty 1

## 2012-08-14 MED ORDER — ISOSORBIDE MONONITRATE ER 30 MG PO TB24: 30.0000 mg | ORAL_TABLET | Freq: Every day | ORAL | Status: DC

## 2012-08-14 NOTE — Clinical Social Work Psychosocial (Signed)
Clinical Social Work Department BRIEF PSYCHOSOCIAL ASSESSMENT 08/14/2012  Patient:  Samuel Meyers, Samuel Meyers     Account Number:  [id number]     Admit date:  08/13/2012  Clinical Social Worker:  Juliette Mangle  Date/Time:  08/14/2012 03:40 PM  Referred by:  RN  Date Referred:  08/14/2012 Referred for  Other - See comment   Other Referral:   It was reported patient was from ALF, [facility]   Interview type:  Family Other interview type:   Patient could not hear very well but was present for the discussion    PSYCHOSOCIAL DATA Living Status:  FACILITY Admitted from facility:  [facility] Level of care:  Independent Living Primary support name:  Lynkin Saini Primary support relationship to patient:  SPOUSE Degree of support available:   Strong and vested    CURRENT CONCERNS Current Concerns  None Noted   Other Concerns:    SOCIAL WORK ASSESSMENT / PLAN CSW received a referral that patient is from ALF, [facility]. CSW met with patient, patient's daughter and wife at bedside. CSW introduced self, explained role and discussed returning to [facility]. Patient's daughter reported that the patient and patient's wife live in the independent wing of [facility]. CSW discussed PT's recommendations( Home Health PT). Patient and patient's family are agreeable to this plan. They reported that the patient has currently been recieving speech therapy at the facility as well and they would like to conitnue with that treatment. CSW informed the CM of their preferences. CSW will sign off as social work intervention is no longer needed.   Assessment/plan status:  Psychosocial Support/Ongoing Assessment of Needs Other assessment/ plan:   Information/referral to community resources:   None noted    PATIENT'S/FAMILY'S RESPONSE TO PLAN OF CARE: Patient appeared ot be doing well and patient's fmaily reported that they are doing well. Patient and patient's daughter and wife were very appreciative  of information and support provided by CSW. CSW will sign off as Social Work intervention is no longer needed.    Sabino Niemann, MSW, [employer] [phone]

## 2012-08-14 NOTE — Evaluation (Signed)
I have read and agree with the below assessment and plan.   Lady Wisham Helen Whitlow PT, DPT Pager: [phone] 

## 2012-08-14 NOTE — Care Management Note (Signed)
    Page 1 of 1   08/16/2012     9:28:22 AM   CARE MANAGEMENT NOTE 08/16/2012  Patient:  Samuel Meyers, Samuel Meyers   Account Number:  [id number]  Date Initiated:  08/14/2012  Documentation initiated by:  Samuel Meyers  Subjective/Objective Assessment:   PT WAS ADMITTED WITH DIFF WITH SWALLOWING AND SPEECH     Action/Plan:   PROGRESSION OF CARE AND DISCHARGE PLANNING   Anticipated DC Date:  08/16/2012   Anticipated DC Plan:  ASSISTED LIVING / REST HOME  In-house referral  Clinical Social Worker      DC Associate Professor  CM consult      Choice offered to / List presented to:             Status of service:  Completed, signed off Medicare Important Message given?   (If response is "NO", the following Medicare IM given date fields will be blank) Date Medicare IM given:   Date Additional Medicare IM given:    Discharge Disposition:  HOME/SELF CARE  Per UR Regulation:  Reviewed for med. necessity/level of care/duration of stay  If discussed at Long Length of Stay Meetings, dates discussed:    Comments:  08/16/12 Samuel Boer, RN,BSN [phone] PT WAS DC'D TO [facility] WITH HH PT/OT.  08/14/12 Samuel Yokum,RN, BSN [id number] PT WAS ADMITTED WITH SLURRED SPEECH FROM AN ALF PTA.  WILL F/U ON DC NEEDS AND RECOMMENDATIONS.

## 2012-08-14 NOTE — Progress Notes (Signed)
PCP: Letitia Libra, Ala Dach, MD  Brief HPI: Patient is a 78 year old Caucasian male with a past medical history of hypertension, coronary disease status post CABG in the 1990s, dementia who presented to the hospital for evaluation of speech difficulty. Patient lives in assisted living facility, he recently moved to [place] to be close to his daughter. At baseline he did have some amount of speech difficulty. He was scheduled to see a speech therapist who found him with more speech difficulty than usual-apparently patient was having difficulty finding words and expressing himself, so he was referred to the ED for further evaluation. Family was present at bedside, per them this started sometime yesterday afternoon. There was no history of headache, nausea vomiting. There was no history of chest pain or shortness of breath.   Past medical history:  Past Medical History  Diagnosis Date  . Hypertension   . Hyperlipidemia   . CAD (coronary artery disease)   . Pulmonary embolus     Time of CABG  . Dementia   . Depression     Consultants: Neurology  Procedures: TEE on 9/12  Subjective: Patient feels well. Still has difficulty finding words. No pain.  Objective: Vital signs in last 24 hours: Temp:  [96.1 F (35.6 C)-98.6 F (37 C)] 96.1 F (35.6 C) (09/11 0952) Pulse Rate:  [52-60] 59  (09/11 0952) Resp:  [14-22] 20  (09/11 0952) BP: (145-197)/(69-121) 176/96 mmHg (09/11 1100) SpO2:  [96 %-100 %] 100 % (09/11 0952) Weight:  [79.6 kg (175 lb 7.8 oz)] 79.6 kg (175 lb 7.8 oz) (09/11 0003) Weight change:     Intake/Output from previous day: 09/10 0701 - 09/11 0700 In: 480.8 [I.V.:480.8] Out: -  Intake/Output this shift:    General appearance: alert, cooperative and with difficulty communicating Head: Normocephalic, without obvious abnormality, atraumatic Resp: clear to auscultation bilaterally Cardio: regular rate and rhythm, S1, S2 normal, no murmur, click, rub or  gallop GI: soft, non-tender; bowel sounds normal; no masses,  no organomegaly Extremities: extremities normal, atraumatic, no cyanosis or edema Neurologic: Alert, expressive aphasia. No focal deficits  Lab Results:  [facility] 08/13/12 2008 08/13/12 1205  WBC 5.8 5.7  HGB 12.6* 12.6*  HCT 36.4* 36.6*  PLT 196 197   BMET  Basename 08/13/12 2008 08/13/12 1205  NA -- 141  K -- 4.4  CL -- 104  CO2 -- 31  GLUCOSE -- 106*  BUN -- 11  CREATININE 0.79 0.80  CALCIUM -- 9.7  ALT -- 22   2D ECHOCARDIOGRAM Study Conclusions  - Left ventricle: The cavity size was normal. Wall thickness was normal. Systolic function was normal. The estimated ejection fraction was in the range of 50% to 55%. Regional wall motion abnormalities cannot be excluded. Left ventricular diastolic function parameters were normal. - Aortic valve: Trivial regurgitation. - Mitral valve: Calcified annulus.    Studies/Results: Ct Head Wo Contrast  08/13/2012  *RADIOLOGY REPORT*  Clinical Data: Confusion.  CT HEAD WITHOUT CONTRAST  Technique:  Contiguous axial images were obtained from the base of the skull through the vertex without contrast.  Comparison: None.  Findings: There is atrophy and chronic small vessel disease changes.  Physiologic calcifications in the basal ganglia. No acute intracranial abnormality.  Specifically, no hemorrhage, hydrocephalus, mass lesion, acute infarction, or significant intracranial injury.  No acute calvarial abnormality. Visualized paranasal sinuses and mastoids clear.  Orbital soft tissues unremarkable.  Prior old right mastoidectomy.  IMPRESSION: No acute intracranial abnormality.  Atrophy, chronic microvascular disease.  Original Report Authenticated By: Cyndie Chime, M.D.    Mr [name] Head Wo Contrast  08/14/2012  *RADIOLOGY REPORT*  Clinical Data:  Speech difficulty. Dementia.  MRI BRAIN WITHOUT CONTRAST MRA HEAD WITHOUT CONTRAST  Technique: Multiplanar, multiecho pulse sequences of  the brain and surrounding structures were obtained according to standard protocol without intravenous contrast.  Angiographic images of the head were obtained using MRA technique without contrast.  Comparison: 08/13/2012 head CT.  No comparison MR.  MRI HEAD  Findings:  Acute small to slightly moderate size non hemorrhagic infarct mid left frontal lobe.  Prominent small vessel disease type changes.  Tiny areas of blood breakdown products most notable left parietal lobe probably related to result of remote hemorrhagic ischemia.  Global atrophy without hydrocephalus.  No intracranial mass lesion detected on this unenhanced exam.  Prior right mastoidectomy.  Partially empty sella.  Atherosclerotic type changes left vertebral artery.  Please see below.  Minimal paranasal sinus mucosal thickening.  IMPRESSION: Acute small to slightly moderate size non hemorrhagic infarct mid left frontal lobe.  Please see above.  MRA HEAD  Findings: Anterior circulation without medium or large size vessel significant stenosis or occlusion.  Middle cerebral artery mild to moderate branch vessel irregularity.  Moderate irregularity and narrowing of the left vertebral artery. Right vertebral artery is dominant.  Nonvisualization right PICA and left AICA.  No significant stenosis of the basilar artery.  Superior cerebellar artery and posterior cerebral artery branch vessel irregularity bilaterally.  No aneurysm or vascular malformation noted.  IMPRESSION: Intracranial atherosclerotic type changes predominately involving branch vessels and the left vertebral artery as noted above.  This has been made a PRA call report utilizing dashboard call feature.   Original Report Authenticated By: Fuller Canada, M.D.    Mr Brain Wo Contrast  08/14/2012  *RADIOLOGY REPORT*  Clinical Data:  Speech difficulty. Dementia.  MRI BRAIN WITHOUT CONTRAST MRA HEAD WITHOUT CONTRAST  Technique: Multiplanar, multiecho pulse sequences of the brain and surrounding  structures were obtained according to standard protocol without intravenous contrast.  Angiographic images of the head were obtained using MRA technique without contrast.  Comparison: 08/13/2012 head CT.  No comparison MR.  MRI HEAD  Findings:  Acute small to slightly moderate size non hemorrhagic infarct mid left frontal lobe.  Prominent small vessel disease type changes.  Tiny areas of blood breakdown products most notable left parietal lobe probably related to result of remote hemorrhagic ischemia.  Global atrophy without hydrocephalus.  No intracranial mass lesion detected on this unenhanced exam.  Prior right mastoidectomy.  Partially empty sella.  Atherosclerotic type changes left vertebral artery.  Please see below.  Minimal paranasal sinus mucosal thickening.  IMPRESSION: Acute small to slightly moderate size non hemorrhagic infarct mid left frontal lobe.  Please see above.  MRA HEAD  Findings: Anterior circulation without medium or large size vessel significant stenosis or occlusion.  Middle cerebral artery mild to moderate branch vessel irregularity.  Moderate irregularity and narrowing of the left vertebral artery. Right vertebral artery is dominant.  Nonvisualization right PICA and left AICA.  No significant stenosis of the basilar artery.  Superior cerebellar artery and posterior cerebral artery branch vessel irregularity bilaterally.  No aneurysm or vascular malformation noted.  IMPRESSION: Intracranial atherosclerotic type changes predominately involving branch vessels and the left vertebral artery as noted above.  This has been made a PRA call report utilizing dashboard call feature.   Original Report Authenticated By: Fuller Canada, M.D.  Medications:  Scheduled:   . aspirin  300 mg Rectal Daily   Or  . aspirin  325 mg Oral Daily  . enoxaparin  40 mg Subcutaneous Q24H  . finasteride  5 mg Oral Daily  . memantine  10 mg Oral BID  . multivitamin with minerals  1 tablet Oral Daily  .  rivastigmine  9.5 mg Transdermal Daily  . simvastatin  40 mg Oral QPM  . DISCONTD: clopidogrel  75 mg Oral Q breakfast  . DISCONTD: multivitamin  1 capsule Oral Daily   Continuous:   . sodium chloride 50 mL/hr at 08/14/12 0600   WUJ:[id number], ondansetron (ZOFRAN) IV, senna-docusate  Assessment/Plan:  Principal Problem:  *CVA (cerebral infarction) Active Problems:  CAD (coronary artery disease) of artery bypass graft  Hyperlipidemia  Dementia  Hypertension  Altered mental status    CVA (cerebral infarction)-subacute  Patient is doing well. No progression of symptoms. Being followed by neurology. Currently on Aspirin. For TEE on 9/12. Further management depending on TEE. PT/OT/SLP. Carotids show no significantly occlusive disease. ECHO as above. LDL is 49. HBA1C is 5.9. Resume statin on discharge.   History of CAD (coronary artery disease) of artery bypass graft  -On aspirin  - Stable  Hyperlipidemia  Resume statin at discharge.  Dementia  -Seems to be at baseline   Hypertension  -Allow for some permissive hypertension, as needed labetalol.   DVT Prophylaxis:  -Prophylactic Lovenox   Code Status:  -Full code   Family Information At bedside  Disposition Per PT/OT. Possible dc 9/12 if TEE is normal.   LOS: 1 day   [facility]  [facility] Pager [phone] 08/14/2012, 12:30 PM

## 2012-08-14 NOTE — Progress Notes (Signed)
Stroke Team Progress Note  HISTORY Samuel Meyers is an 78 y.o. male who lives at a assisted living center and recently moved to [facility]. Patient at baseline has neuro degenerative decline but not formally diagnosed with dementia. He is on the Exelon patch and Nemenda. He also has speech difficulty at baseline and works with SLP at the home. 08/13/2012 it was noted he was having difficulty expressing himself and having paraphasic errors. He was brought to [facility] for further evaluation. Patient was not a TPA candidate secondary to presenting outside the window. He was admitted for further evaluation and treatment.  SUBJECTIVE His daughter is at the bedside.  Overall he feels his condition is gradually improving.   OBJECTIVE Most recent Vital Signs: Filed Vitals:   08/14/12 0003 08/14/12 0200 08/14/12 0544 08/14/12 0952  BP: 171/76 164/80 178/72 183/77  Pulse: 52 56 52 59  Temp: 98.6 F (37 C) 98.6 F (37 C) 97.4 F (36.3 C) 96.1 F (35.6 C)  TempSrc: Oral Oral Oral Oral  Resp: 22 22 22 20   Weight: 79.6 kg (175 lb 7.8 oz)     SpO2: 99% 96% 100% 100%   CBG (last 3)   Basename 08/14/12 0734 08/14/12 0437 08/14/12  GLUCAP 86 84 93   Intake/Output from previous day: 09/10 0701 - 09/11 0700 In: 480.8 [I.V.:480.8] Out: -   IV Fluid Intake:     . sodium chloride 50 mL/hr at 08/14/12 0600    MEDICATIONS    . aspirin  300 mg Rectal Daily   Or  . aspirin  325 mg Oral Daily  . enoxaparin  40 mg Subcutaneous Q24H  . finasteride  5 mg Oral Daily  . memantine  10 mg Oral BID  . multivitamin with minerals  1 tablet Oral Daily  . rivastigmine  9.5 mg Transdermal Daily  . simvastatin  40 mg Oral QPM  . DISCONTD: clopidogrel  75 mg Oral Q breakfast  . DISCONTD: multivitamin  1 capsule Oral Daily   PRN:  labetalol, ondansetron (ZOFRAN) IV, senna-docusate  Diet:  General thin liquids Activity:  OOB in chair DVT Prophylaxis:  Lovenox 40 mg sq daily   CLINICALLY SIGNIFICANT  STUDIES Basic Metabolic Panel:  Lab 08/13/12 4098 08/13/12 1205  NA -- 141  K -- 4.4  CL -- 104  CO2 -- 31  GLUCOSE -- 106*  BUN -- 11  CREATININE 0.79 0.80  CALCIUM -- 9.7  MG -- --  PHOS -- --   Liver Function Tests:  Lab 08/13/12 1205  AST 27  ALT 22  ALKPHOS 52  BILITOT 0.4  PROT 7.0  ALBUMIN 4.0   CBC:  Lab 08/13/12 2008 08/13/12 1205  WBC 5.8 5.7  NEUTROABS -- 3.6  HGB 12.6* 12.6*  HCT 36.4* 36.6*  MCV 90.8 91.3  PLT 196 197   Coagulation:  Lab 08/13/12 1205  LABPROT 14.5  INR 1.11   Cardiac Enzymes: No results found for this basename: CKTOTAL:3,CKMB:3,CKMBINDEX:3,TROPONINI:3 in the last 168 hours Urinalysis:  Lab 08/13/12 1209  COLORURINE YELLOW  LABSPEC 1.009  PHURINE 7.0  GLUCOSEU NEGATIVE  HGBUR NEGATIVE  BILIRUBINUR NEGATIVE  KETONESUR NEGATIVE  PROTEINUR NEGATIVE  UROBILINOGEN 0.2  NITRITE NEGATIVE  LEUKOCYTESUR NEGATIVE   Lipid Panel    Component Value Date/Time   CHOL 118 08/14/2012 0619   TRIG 106 08/14/2012 0619   HDL 48 08/14/2012 0619   CHOLHDL 2.5 08/14/2012 0619   VLDL 21 08/14/2012 0619   LDLCALC 49 08/14/2012 1191  HgbA1C  Lab Results  Component Value Date   HGBA1C 5.9* 08/13/2012    Urine Drug Screen:   No results found for this basename: labopia, cocainscrnur, labbenz, amphetmu, thcu, labbarb    Alcohol Level: No results found for this basename: ETH:2 in the last 168 hours   CT of the brain   No acute intracranial abnormality.  Atrophy, chronic microvascular disease.     MRI of the brain  Acute small to slightly moderate size non hemorrhagic infarct mid left frontal lobe.    MRA of the brain  Intracranial atherosclerotic type changes predominately involving branch vessels and the left vertebral artery   2D Echocardiogram  EF 50-55% with no source of embolus.   Carotid Doppler  No internal carotid artery stenosis bilaterally. Vertebrals with antegrade flow bilaterally.   CXR    EKG  normal sinus rhythm, 1st degree  AV block.   Therapy Recommendations PT - HH; OT - ; ST -   Physical Exam   Pleasant elderly Caucasian male currently not in distress.Awake alert. Afebrile. Head is nontraumatic. Neck is supple without bruit. Hearing is normal. Cardiac exam no murmur or gallop. Lungs are clear to auscultation. Distal pulses are well felt.  Neurological Exam ;Awake alert oriented x2. Significant expressive aphasia with word hesitancy and paraphasic errors. Comprehension appears preserved. Mild difficulty with naming and repetition. Extraocular moments are full range without nystagmus. Blinks to threat bilaterally. Mild right lower facial symmetry. Tongue is midline. Moves about for extra and is quite well. Mild diminished fine finger movements on the right hand. Her extended stem seems symmetric. Gait was not tested. Sensation is intact. Coordination appears normal. ASSESSMENT Mr. Samuel Meyers is a 78 y.o. male presenting with increased difficulty expressing himself with paraphasic errors. Imaging confirms a medial left frontal lobe infarct. Infarct felt to be embolic secondary to likely atrial fibrillation (unsubstantiated).  Work up underway. On aspirin 325 mg orally every day prior to admission. Now on aspirin 325 mg orally every day for secondary stroke prevention. Patient with resultant ongoing expressive aphasia.   Baseline memory deficit, on namenda and exelon patch  Hypertension  Hylerlipidmia, LDL 49  CAD, CABG  PE at time of CABG  Depression  HgbA1c 5.9  Hospital day # 1  TREATMENT/PLAN  Continue aspirin 325 mg orally every day for secondary stroke prevention. Will likely change to plavix if no afib identified TEE to look for embolic source. Arranged with [facility] for 08/15/2012. Will be NPO after midnight. If positive for PFO (patent foramen ovale), check bilateral lower extremity venous dopplers to rule out DVT as possible source of stroke.  If TEE negative, please schedule  outpatient telemetry monitoring to assess patient for atrial fibrillation as source of stroke. May be arranged with patient's cardiologist, or cardiologist of choice.  HH PT and ST at discharge  Annie Main, MSN, RN, ANVP-BC, ANP-BC, GNP-BC [facility] Pager: [phone] 08/14/2012 10:45 AM  Scribe for Dr. Delia Heady, Stroke Center Medical Director, who has personally reviewed chart, pertinent data, examined the patient and developed the plan of care. Pager:  [phone]

## 2012-08-14 NOTE — Evaluation (Signed)
SLP has reviewed and agrees with student's note below.  Samuel Meyers Willis Halli Equihua M.Ed CCC-SLP Pager [phone]  08/14/2012  

## 2012-08-14 NOTE — Evaluation (Signed)
SLP has reviewed and agrees with student's note below.  Breck Coons Anza.Ed [employer] [phone]  08/14/2012

## 2012-08-14 NOTE — Evaluation (Signed)
Speech Language Pathology Evaluation Patient Details Name: Samuel Meyers MRN: [id number] DOB: 03/30/1934 Today's Date: 08/14/2012 Time:  -     Problem List:  Patient Active Problem List  Diagnosis  . CAD (coronary artery disease) of artery bypass graft  . Essential hypertension, malignant  . Hyperlipidemia  . CAD (coronary artery disease)  . Dementia  . Depression  . Hypertension  . Altered mental status  . CVA (cerebral infarction)   Past Medical History:  Past Medical History  Diagnosis Date  . Hypertension   . Hyperlipidemia   . CAD (coronary artery disease)   . Pulmonary embolus     Time of CABG  . Dementia   . Depression    Past Surgical History:  Past Surgical History  Procedure Date  . Coronary artery bypass graft     [place]  . External ear surgery   . Tonsillectomy    HPI:  Pt. is 78 y/o male with dementia, HTN currently living in assisted living facility. Complaining of difficulty finding words. CT showed now acute intercranial abonormality. MRI showed acute non hemorrhagic infarct in mid left frontal lobe.     Assessment / Plan / Recommendation Clinical Impression  Pt. seen for speech-language-cognitive evaluation after having a non-hemorrhagic infarct in mid left frontal lobe. Pt. has a h/x of dementia and family reports difficulty expressing basic thoughts/needs throughout the past 6 months (significant at times). Pt presents with a moderate Broca's aphasia characterized by aggramatism, phonemic paraphasia, dysnomia, and frequent dysfluency.   Pt. demonstrates emergent awareness to majority of verbal errors. He exhibits mild-moderate receptive aphasia.Marland Kitchen   SLP recommends ST to facilitate language deficits as well as subtle impairments in cognition.     SLP Assessment  Patient needs continued Speech Lanaguage Pathology Services    Follow Up Recommendations  Outpatient SLP    Frequency and Duration min 3x week  2 weeks      SLP Goals  SLP  Goals Potential to Achieve Goals: Good Potential Considerations: Ability to learn/carryover information;Co-morbidities Progress/Goals/Alternative treatment plan discussed with pt/caregiver and they: Agree SLP Goal #1: Pt. will name common room objects with max phrase completion cues.  SLP Goal #2: Pt. will follow a 2 step command with min verbal cues. SLP Goal #3: Pt. will match written word to field of 3 objects with min verbal cues. SLP Goal #4: Pt. will write biographical information at word level with mod verbal/visual cues.  SLP Evaluation Prior Functioning  Cognitive/Linguistic Baseline: Baseline deficits Baseline deficit details: Family reports difficulty with naming and "getting words" out correctly.  Type of Home: Assisted living Lives With: Spouse (at ALF-independent) Available Help at Discharge: Family Vocation: Retired   IT consultant  Overall Cognitive Status: Impaired at baseline Arousal/Alertness: Awake/alert Orientation Level: Oriented to person;Oriented to place Attention: Selective Selective Attention: Appears intact Memory: Impaired Memory Impairment: Decreased recall of new information;Retrieval deficit;Storage deficit;Decreased short term memory Decreased Short Term Memory: Verbal basic Awareness: Appears intact Problem Solving: Impaired Problem Solving Impairment: Verbal basic;Functional basic;Other (comment) (frustration and anxiety affecting problem solving) Safety/Judgment: Appears intact    Comprehension  Auditory Comprehension Overall Auditory Comprehension: Impaired Yes/No Questions: Within Functional Limits Commands: Impaired (point to me, make a fist, then wave ) Multistep Basic Commands: 50-74% accurate Conversation: Simple Interfering Components: Anxiety EffectiveTechniques: Increased volume (deaf in right ear ) Visual Recognition/Discrimination Discrimination: Not tested Reading Comprehension Reading Status: Not tested    Expression  Expression Primary Mode of Expression: Verbal Verbal Expression Overall Verbal Expression: Impaired  at baseline Initiation: No impairment Level of Generative/Spontaneous Verbalization: Conversation Repetition: Impaired Level of Impairment: Sentence level Naming: Impairment Responsive: 0-25% accurate Confrontation: Impaired (0/3 common objects) Convergent: Not tested Divergent: Not tested Other Naming Comments:  (X confrontational and responsive naming ) Verbal Errors: Aware of errors;Phonemic paraphasias;Semantic paraphasias (/c/ for cup, "water" for cup, "time" for watch ) Pragmatics: No impairment Interfering Components: Premorbid deficit (h/x of dementia ) Effective Techniques: Phonemic cues;Written cues Non-Verbal Means of Communication: Gestures Written Expression Written Expression: Not tested   Oral / Motor Oral Motor/Sensory Function Overall Oral Motor/Sensory Function: Impaired Labial ROM: Within Functional Limits Labial Symmetry: Within Functional Limits Labial Strength: Within Functional Limits Labial Sensation: Within Functional Limits Lingual ROM:  (reduced elevation ) Lingual Symmetry: Within Functional Limits Lingual Strength: Within Functional Limits Lingual Sensation: Within Functional Limits Facial ROM: Within Functional Limits Facial Symmetry: Within Functional Limits Facial Strength: Within Functional Limits Facial Sensation: Within Functional Limits Velum: Within Functional Limits Mandible: Within Functional Limits Motor Speech Overall Motor Speech: Appears within functional limits for tasks assessed Respiration: Within functional limits Phonation: Normal Resonance: Within functional limits Articulation: Within functional limitis Intelligibility: Intelligible Motor Planning: Witnin functional limits Motor Speech Errors: Not applicable Interfering Components: Premorbid status;Hearing loss   GO     Samuel Meyers 08/14/2012, 3:20 PM

## 2012-08-14 NOTE — Evaluation (Signed)
Physical Therapy Evaluation Patient Details Name: Samuel Meyers MRN: [id number] DOB: 02/07/1934 Today's Date: 08/14/2012 Time: 4098-1191 PT Time Calculation (min): 42 min  PT Assessment / Plan / Recommendation Clinical Impression  Pt is a 78 yo male admitted with increased word finding difficulties and per pt's chart has a left frontal lobe infarct. Pt presents to physical therapy evaluation with decreased balance and will benefit from physical therpay in the acute care setting to improve balance to reduce fall risk. Pt has a history of dementia and lives in independent unit of ALF with spouse, where pt's daughter reports he was receiving home health speech therapy. Next session attempt straight cane on left side to decrease fall risk. Recommending HHPT follow up at ALF at discharge.    PT Assessment  Patient needs continued PT services    Follow Up Recommendations  Home health PT;Supervision for mobility/OOB    Barriers to Discharge        Equipment Recommendations  None recommended by PT    Recommendations for Other Services     Frequency Min 4X/week    Precautions / Restrictions Precautions Precautions: Fall Restrictions Weight Bearing Restrictions: No         Mobility  Bed Mobility Bed Mobility: Supine to Sit Supine to Sit: 6: Modified independent (Device/Increase time) Details for Bed Mobility Assistance: Pt modified independent with bed mobility. Transfers Transfers: Sit to Stand;Stand to Sit Sit to Stand: 5: Supervision Stand to Sit: 5: Supervision Details for Transfer Assistance: Pt supervision for safety due to impulsiveness (pt sit->stand when asked to sit EOB). Ambulation/Gait Ambulation/Gait Assistance: 4: Min guard;5: Supervision Ambulation Distance (Feet): 200 Feet Assistive device: None Ambulation/Gait Assistance Details: Pt min guard for safety due to impulsiveness and mild LOB with turning that pt was able to self correct. Pt with decreased spatial  awareness on right side, requiring verbal cueing to avoid running into objects on right side. While ambulating, pt with lateral trunk lean to the left and elevated left shoulder. Gait Pattern: Step-to pattern;Decreased stride length Gait velocity: slow, cautious gait Stairs: No Modified Rankin (Stroke Patients Only) Pre-Morbid Rankin Score: Slight disability Modified Rankin: Moderately severe disability    Exercises     PT Diagnosis: Abnormality of gait;Difficulty walking  PT Problem List: Decreased balance;Decreased mobility;Decreased safety awareness PT Treatment Interventions: Gait training;Functional mobility training;Therapeutic activities;Therapeutic exercise;Balance training;Neuromuscular re-education;Patient/family education   PT Goals Acute Rehab PT Goals PT Goal Formulation: With patient Time For Goal Achievement: 08/28/12 Potential to Achieve Goals: Good Pt will go Sit to Stand: with modified independence PT Goal: Sit to Stand - Progress: Goal set today Pt will go Stand to Sit: with modified independence PT Goal: Stand to Sit - Progress: Goal set today Pt will Ambulate: >150 feet;with supervision PT Goal: Ambulate - Progress: Goal set today Additional Goals Additional Goal #1: Pt will improve score on Berg balance test to 46/56 (initial score of 41/56) to decrease fall risk. PT Goal: Additional Goal #1 - Progress: Goal set today  Visit Information  Last PT Received On: 08/14/12 Assistance Needed: +1    Subjective Data  Subjective: "I am having a hard time with words."   Prior Functioning  Home Living Lives With: Spouse (at ALF-independent) Available Help at Discharge: Family;Available 24 hours/day Type of Home: Assisted living (in the independent living units) Home Access: Level entry Home Layout: One level Bathroom Shower/Tub: Naval architect Equipment: None Additional Comments: Pt with history of mild dementia and lives in independent units at  ALF. Prior Function Level of Independence: Independent Able to Take Stairs?: Yes Driving: No Vocation: Retired Musician: Archivist (comment) (deaf in left ear)    Cognition  Overall Cognitive Status: History of cognitive impairments - at baseline Arousal/Alertness: Awake/alert Orientation Level: Appears intact for tasks assessed Behavior During Session: [facility] for tasks performed Cognition - Other Comments: Pt mildly impulsive.    Extremity/Trunk Assessment Right Upper Extremity Assessment RUE ROM/Strength/Tone: Within functional levels RUE Sensation: WFL - Light Touch Left Upper Extremity Assessment LUE ROM/Strength/Tone: Within functional levels LUE Sensation: WFL - Light Touch Right Lower Extremity Assessment RLE ROM/Strength/Tone: Within functional levels RLE Sensation: WFL - Light Touch Left Lower Extremity Assessment LLE ROM/Strength/Tone: Within functional levels LLE Sensation: WFL - Light Touch Trunk Assessment Trunk Assessment: Normal   Balance Balance Balance Assessed: Yes Standardized Balance Assessment Standardized Balance Assessment: Berg Balance Test Berg Balance Test Sit to Stand: Able to stand without using hands and stabilize independently Standing Unsupported: Able to stand 2 minutes with supervision Sitting with Back Unsupported but Feet Supported on Floor or Stool: Able to sit safely and securely 2 minutes Stand to Sit: Sits safely with minimal use of hands Transfers: Able to transfer safely, minor use of hands Standing Unsupported with Eyes Closed: Able to stand 10 seconds safely Standing Ubsupported with Feet Together: Able to place feet together independently and stand for 1 minute with supervision From Standing, Reach Forward with Outstretched Arm: Reaches forward but needs supervision From Standing Position, Pick up Object from Floor: Able to pick up shoe, needs supervision From Standing Position, Turn to  Look Behind Over each Shoulder: Looks behind one side only/other side shows less weight shift Turn 360 Degrees: Able to turn 360 degrees safely in 4 seconds or less Standing Unsupported, Alternately Place Feet on Step/Stool: Able to stand independently and complete 8 steps >20 seconds Standing Unsupported, One Foot in Front: Loses balance while stepping or standing Standing on One Leg: Tries to lift leg/unable to hold 3 seconds but remains standing independently Total Score: 41   End of Session PT - End of Session Equipment Utilized During Treatment: Gait belt Activity Tolerance: Patient tolerated treatment well Patient left: in chair;with call bell/phone within reach;with family/visitor present Nurse Communication: Mobility status  GP     Tonesha Tsou 08/14/2012, 2:32 PM

## 2012-08-14 NOTE — Progress Notes (Signed)
Radiology called to inform of MRI results. MD paged. Passed along to AM RN.

## 2012-08-14 NOTE — Evaluation (Signed)
Clinical/Bedside Swallow Evaluation Patient Details  Name: Samuel Meyers MRN: [id number] Date of Birth: July 30, 1934  Today's Date: 08/14/2012 Time:  -     Past Medical History:  Past Medical History  Diagnosis Date  . Hypertension   . Hyperlipidemia   . CAD (coronary artery disease)   . Pulmonary embolus     Time of CABG  . Dementia   . Depression    Past Surgical History:  Past Surgical History  Procedure Date  . Coronary artery bypass graft     [place]  . External ear surgery   . Tonsillectomy    HPI:  Pt. is 78 y/o male with dementia, HTN currently living in assisted living facility. Complaining of difficulty finding words. CT showed now acute intercranial abonormality. MRI showed acute non hemorrhagic infarct in mid left frontal lobe.     Assessment / Plan / Recommendation Clinical Impression  Pt. seen for bedside swallow evaluation. Minimal verbal cueing required for Pt. to attend and engage in activity. Oral preparation, mastication and transit of cracker was functional.  No s/s of aspiration observed at bedside with all consistencies (ice chip, water, puree, cracker).  Pt. and family educated to take small sips and bites. ST will continue to see pt. for aphasia and no follow up for swallow.     Aspiration Risk  Mild    Diet Recommendation Regular;Thin liquid   Liquid Administration via: Cup Medication Administration: Whole meds with liquid Supervision: Patient able to self feed Compensations: Slow rate;Small sips/bites Postural Changes and/or Swallow Maneuvers: Seated upright 90 degrees    Other  Recommendations Oral Care Recommendations: Oral care BID   Follow Up Recommendations  None    Frequency and Duration min 3x week          SLP Swallow Goals     Swallow Study Prior Functional Status  Cognitive/Linguistic Baseline: Baseline deficits Baseline deficit details: Family reports difficulty with naming and "getting words" out correctly.    Type of Home: Assisted living Lives With: Spouse (at ALF-independent) Available Help at Discharge: Family Vocation: Retired    General HPI: Pt. is 78 y/o male with dementia, HTN currently living in assisted living facility. Complaining of difficulty finding words. CT showed now acute intercranial abonormality. MRI showed acute non hemorrhagic infarct in mid left frontal lobe.   Respiratory Status: Room air History of Recent Intubation: No Behavior/Cognition: Alert;Pleasant mood;Cooperative;Hard of hearing Oral Cavity - Dentition: Dentures, top;Dentures, bottom Baseline Vocal Quality: Clear Volitional Cough: Strong Volitional Swallow: Able to elicit    Oral/Motor/Sensory Function Overall Oral Motor/Sensory Function: Impaired Labial ROM: Within Functional Limits Labial Symmetry: Within Functional Limits Labial Strength: Within Functional Limits Labial Sensation: Within Functional Limits Lingual ROM:  (reduced elevation) Lingual Symmetry: Within Functional Limits Lingual Strength: Within Functional Limits Lingual Sensation: Within Functional Limits Facial ROM: Within Functional Limits Facial Symmetry: Within Functional Limits Facial Strength: Within Functional Limits Facial Sensation: Within Functional Limits Velum: Within Functional Limits Mandible: Within Functional Limits   Ice Chips Ice chips: Within functional limits Presentation: Spoon;Cup   Thin Liquid Thin Liquid: Within functional limits Presentation: Cup;Spoon    Nectar Thick Nectar Thick Liquid: Not tested   Honey Thick Honey Thick Liquid: Not tested   Puree Puree: Within functional limits Presentation: Self Fed;Spoon   Solid   GO    Solid: Within functional limits Presentation: Self Samuel Meyers, Samuel Meyers 08/14/2012,3:22 PM

## 2012-08-15 ENCOUNTER — Encounter (HOSPITAL_COMMUNITY)
Admission: EM | Disposition: A | Discharge status: Home Health | Payer: Self-pay | Source: Home / Self Care | Attending: Internal Medicine

## 2012-08-15 ENCOUNTER — Encounter (HOSPITAL_COMMUNITY): Payer: Self-pay | Admitting: *Deleted

## 2012-08-15 DIAGNOSIS — I6789 Other cerebrovascular disease: Secondary | ICD-10-CM

## 2012-08-15 HISTORY — PX: TEE WITHOUT CARDIOVERSION: SHX5443

## 2012-08-15 LAB — GLUCOSE, CAPILLARY
Glucose-Capillary: 152 mg/dL — ABNORMAL HIGH (ref 70–99)
Glucose-Capillary: 228 mg/dL — ABNORMAL HIGH (ref 70–99)
Glucose-Capillary: 94 mg/dL (ref 70–99)

## 2012-08-15 SURGERY — ECHOCARDIOGRAM, TRANSESOPHAGEAL
Anesthesia: Moderate Sedation

## 2012-08-15 MED ORDER — LIDOCAINE VISCOUS 2 % MT SOLN
OROMUCOSAL | Status: AC
  Filled 2012-08-15: qty 15

## 2012-08-15 MED ORDER — MIDAZOLAM HCL 5 MG/ML IJ SOLN
INTRAMUSCULAR | Status: AC
  Filled 2012-08-15: qty 2

## 2012-08-15 MED ORDER — FENTANYL CITRATE 0.05 MG/ML IJ SOLN
INTRAMUSCULAR | Status: AC
  Filled 2012-08-15: qty 2

## 2012-08-15 MED ORDER — SODIUM CHLORIDE 0.9 % IV SOLN
INTRAVENOUS | Status: DC
Start: 2012-08-15 — End: 2012-08-15
  Administered 2012-08-15: 15:00:00 via INTRAVENOUS

## 2012-08-15 MED ORDER — CLOPIDOGREL BISULFATE 75 MG PO TABS: 75.0000 mg | ORAL_TABLET | Freq: Every day | ORAL | Status: DC

## 2012-08-15 MED ORDER — CLOPIDOGREL BISULFATE 75 MG PO TABS
75.0000 mg | ORAL_TABLET | Freq: Every day | ORAL | Status: DC
  Administered 2012-08-15: 75 mg via ORAL
  Filled 2012-08-15: qty 1

## 2012-08-15 MED ORDER — FENTANYL CITRATE 0.05 MG/ML IJ SOLN
INTRAMUSCULAR | Status: DC | PRN
  Administered 2012-08-15 (×2): 25 ug via INTRAVENOUS

## 2012-08-15 MED ORDER — MIDAZOLAM HCL 10 MG/2ML IJ SOLN
INTRAMUSCULAR | Status: DC | PRN
  Administered 2012-08-15: 1 mg via INTRAVENOUS
  Administered 2012-08-15 (×2): 2 mg via INTRAVENOUS

## 2012-08-15 MED ORDER — LIDOCAINE VISCOUS 2 % MT SOLN
OROMUCOSAL | Status: DC | PRN
  Administered 2012-08-15: 10 mL via OROMUCOSAL

## 2012-08-15 NOTE — Interval H&P Note (Signed)
History and Physical Interval Note:  08/15/2012 3:40 PM  Samuel Meyers  has presented today for surgery, with the diagnosis of tee  The various methods of treatment have been discussed with the patient and family. After consideration of risks, benefits and other options for treatment, the patient has consented to  Procedure(s) (LRB) with comments: TRANSESOPHAGEAL ECHOCARDIOGRAM (TEE) (N/A) as a surgical intervention .  The patient's history has been reviewed, patient examined, no change in status, stable for surgery.  I have reviewed the patient's chart and labs.  Questions were answered to the patient's satisfaction.     [name]

## 2012-08-15 NOTE — Progress Notes (Signed)
  Echocardiogram Echocardiogram Transesophageal has been performed.  Samuel Meyers 08/15/2012, 4:13 PM

## 2012-08-15 NOTE — Progress Notes (Signed)
PCP: Letitia Libra, Ala Dach, MD  Brief HPI: Patient is a 78 year old Caucasian male with a past medical history of hypertension, coronary disease status post CABG in the 1990s, dementia who presented to the hospital for evaluation of speech difficulty. Patient lives in assisted living facility, he recently moved to [place] to be close to his daughter. At baseline he did have some amount of speech difficulty. He was scheduled to see a speech therapist who found him with more speech difficulty than usual-apparently patient was having difficulty finding words and expressing himself, so he was referred to the ED for further evaluation. Family was present at bedside, per them this started sometime yesterday afternoon. There was no history of headache, nausea vomiting. There was no history of chest pain or shortness of breath.   Past medical history:  Past Medical History  Diagnosis Date  . Hypertension   . Hyperlipidemia   . CAD (coronary artery disease)   . Pulmonary embolus     Time of CABG  . Dementia   . Depression     Consultants: Neurology  Procedures: TEE on 9/12  Subjective: Patient feels well. Feels his speech is improving. No pain.  Objective: Vital signs in last 24 hours: Temp:  [96.1 F (35.6 C)-98.2 F (36.8 C)] 97.8 F (36.6 C) (09/12 0600) Pulse Rate:  [57-68] 57  (09/12 0600) Resp:  [20] 20  (09/12 0600) BP: (136-185)/(62-96) 159/62 mmHg (09/12 0600) SpO2:  [95 %-100 %] 98 % (09/12 0600) Weight change:     Intake/Output from previous day: 09/11 0701 - 09/12 0700 In: 760 [P.O.:760] Out: -  Intake/Output this shift:    General appearance: alert, cooperative and with difficulty communicating Head: Normocephalic, without obvious abnormality, atraumatic Resp: clear to auscultation bilaterally Cardio: regular rate and rhythm, S1, S2 normal, no murmur, click, rub or gallop GI: soft, non-tender; bowel sounds normal; no masses,  no organomegaly Extremities:  extremities normal, atraumatic, no cyanosis or edema Neurologic: Alert, expressive aphasia. Speech appears to be better. No focal deficits  Lab Results:  [facility] 08/13/12 2008 08/13/12 1205  WBC 5.8 5.7  HGB 12.6* 12.6*  HCT 36.4* 36.6*  PLT 196 197   BMET  Basename 08/13/12 2008 08/13/12 1205  NA -- 141  K -- 4.4  CL -- 104  CO2 -- 31  GLUCOSE -- 106*  BUN -- 11  CREATININE 0.79 0.80  CALCIUM -- 9.7  ALT -- 22   2D ECHOCARDIOGRAM Study Conclusions  - Left ventricle: The cavity size was normal. Wall thickness was normal. Systolic function was normal. The estimated ejection fraction was in the range of 50% to 55%. Regional wall motion abnormalities cannot be excluded. Left ventricular diastolic function parameters were normal. - Aortic valve: Trivial regurgitation. - Mitral valve: Calcified annulus.   Studies/Results: Ct Head Wo Contrast  08/13/2012  *RADIOLOGY REPORT*  Clinical Data: Confusion.  CT HEAD WITHOUT CONTRAST  Technique:  Contiguous axial images were obtained from the base of the skull through the vertex without contrast.  Comparison: None.  Findings: There is atrophy and chronic small vessel disease changes.  Physiologic calcifications in the basal ganglia. No acute intracranial abnormality.  Specifically, no hemorrhage, hydrocephalus, mass lesion, acute infarction, or significant intracranial injury.  No acute calvarial abnormality. Visualized paranasal sinuses and mastoids clear.  Orbital soft tissues unremarkable.  Prior old right mastoidectomy.  IMPRESSION: No acute intracranial abnormality.  Atrophy, chronic microvascular disease.   Original Report Authenticated By: Cyndie Chime, M.D.  Mr [name] Head Wo Contrast  08/14/2012  *RADIOLOGY REPORT*  Clinical Data:  Speech difficulty. Dementia.  MRI BRAIN WITHOUT CONTRAST MRA HEAD WITHOUT CONTRAST  Technique: Multiplanar, multiecho pulse sequences of the brain and surrounding structures were obtained according to  standard protocol without intravenous contrast.  Angiographic images of the head were obtained using MRA technique without contrast.  Comparison: 08/13/2012 head CT.  No comparison MR.  MRI HEAD  Findings:  Acute small to slightly moderate size non hemorrhagic infarct mid left frontal lobe.  Prominent small vessel disease type changes.  Tiny areas of blood breakdown products most notable left parietal lobe probably related to result of remote hemorrhagic ischemia.  Global atrophy without hydrocephalus.  No intracranial mass lesion detected on this unenhanced exam.  Prior right mastoidectomy.  Partially empty sella.  Atherosclerotic type changes left vertebral artery.  Please see below.  Minimal paranasal sinus mucosal thickening.  IMPRESSION: Acute small to slightly moderate size non hemorrhagic infarct mid left frontal lobe.  Please see above.  MRA HEAD  Findings: Anterior circulation without medium or large size vessel significant stenosis or occlusion.  Middle cerebral artery mild to moderate branch vessel irregularity.  Moderate irregularity and narrowing of the left vertebral artery. Right vertebral artery is dominant.  Nonvisualization right PICA and left AICA.  No significant stenosis of the basilar artery.  Superior cerebellar artery and posterior cerebral artery branch vessel irregularity bilaterally.  No aneurysm or vascular malformation noted.  IMPRESSION: Intracranial atherosclerotic type changes predominately involving branch vessels and the left vertebral artery as noted above.  This has been made a PRA call report utilizing dashboard call feature.   Original Report Authenticated By: Fuller Canada, M.D.    Mr Brain Wo Contrast  08/14/2012  *RADIOLOGY REPORT*  Clinical Data:  Speech difficulty. Dementia.  MRI BRAIN WITHOUT CONTRAST MRA HEAD WITHOUT CONTRAST  Technique: Multiplanar, multiecho pulse sequences of the brain and surrounding structures were obtained according to standard protocol without  intravenous contrast.  Angiographic images of the head were obtained using MRA technique without contrast.  Comparison: 08/13/2012 head CT.  No comparison MR.  MRI HEAD  Findings:  Acute small to slightly moderate size non hemorrhagic infarct mid left frontal lobe.  Prominent small vessel disease type changes.  Tiny areas of blood breakdown products most notable left parietal lobe probably related to result of remote hemorrhagic ischemia.  Global atrophy without hydrocephalus.  No intracranial mass lesion detected on this unenhanced exam.  Prior right mastoidectomy.  Partially empty sella.  Atherosclerotic type changes left vertebral artery.  Please see below.  Minimal paranasal sinus mucosal thickening.  IMPRESSION: Acute small to slightly moderate size non hemorrhagic infarct mid left frontal lobe.  Please see above.  MRA HEAD  Findings: Anterior circulation without medium or large size vessel significant stenosis or occlusion.  Middle cerebral artery mild to moderate branch vessel irregularity.  Moderate irregularity and narrowing of the left vertebral artery. Right vertebral artery is dominant.  Nonvisualization right PICA and left AICA.  No significant stenosis of the basilar artery.  Superior cerebellar artery and posterior cerebral artery branch vessel irregularity bilaterally.  No aneurysm or vascular malformation noted.  IMPRESSION: Intracranial atherosclerotic type changes predominately involving branch vessels and the left vertebral artery as noted above.  This has been made a PRA call report utilizing dashboard call feature.   Original Report Authenticated By: Fuller Canada, M.D.     Medications:  Scheduled:    . aspirin  300  mg Rectal Daily   Or  . aspirin  325 mg Oral Daily  . enoxaparin  40 mg Subcutaneous Q24H  . finasteride  5 mg Oral Daily  . memantine  10 mg Oral BID  . multivitamin with minerals  1 tablet Oral Daily  . rivastigmine  9.5 mg Transdermal Daily  . simvastatin  40 mg  Oral QPM  . DISCONTD: carvedilol  6.25 mg Oral BID WC  . DISCONTD: isosorbide mononitrate  30 mg Oral Daily   Continuous:    . DISCONTD: sodium chloride 50 mL/hr at 08/14/12 0600   UJW:[id number], ondansetron (ZOFRAN) IV, senna-docusate  Assessment/Plan:  Principal Problem:  *CVA (cerebral infarction) Active Problems:  CAD (coronary artery disease) of artery bypass graft  Hyperlipidemia  Dementia  Hypertension  Altered mental status    CVA (cerebral infarction)-subacute  Patient is doing well and improving. Being followed by neurology. Currently on Aspirin. For TEE on 9/12. Further management depending on TEE. PT/OT/SLP. Carotids show no significantly occlusive disease. ECHO as above. LDL is 49. HBA1C is 5.9. Resume statin on discharge.   History of CAD (coronary artery disease) of artery bypass graft  Stable  Hyperlipidemia  Resume statin at discharge.  Dementia  Seems to be at baseline   Hypertension  Allow for some permissive hypertension. As needed labetalol.   DVT Prophylaxis:  Prophylactic Lovenox   Code Status:  Full code   Family Information At bedside  Disposition Needs home health. Apparently TEE is scheduled for later this afternoon. He may not be able to go home till 9/13.   LOS: 2 days   [facility]  [facility] Pager [phone] 08/15/2012, 9:40 AM

## 2012-08-15 NOTE — Progress Notes (Signed)
Physical Therapy Treatment Patient Details Name: Samuel Meyers MRN: [id number] DOB: Nov 20, 1934 Today's Date: 08/15/2012 Time: 1010-1037 PT Time Calculation (min): 27 min  PT Assessment / Plan / Recommendation Comments on Treatment Session  Pt making steady progress with less assitance and cues needed today with dynamic balance activites.    Follow Up Recommendations  Home health PT;Supervision for mobility/OOB       Equipment Recommendations  None recommended by SLP       Frequency Min 4X/week   Plan Discharge plan remains appropriate;Frequency remains appropriate    Precautions / Restrictions Precautions Precautions: Fall    Pertinent Vitals/Pain No complaints of pain with session today.    Mobility  Bed Mobility Bed Mobility: Sit to Supine;Scooting to HOB Supine to Sit: 7: Independent Sit to Supine: 7: Independent Scooting to HOB: 7: Independent Details for Bed Mobility Assistance: no rails and HOB flat. Transfers Sit to Stand: 5: Supervision;From bed;With upper extremity assist Stand to Sit: 6: Modified independent (Device/Increase time);To bed;Without upper extremity assist Details for Transfer Assistance: cues to slow down for safety with standing. useds hands to assist with transfers. Ambulation/Gait Ambulation/Gait Assistance: 4: Min guard Ambulation Distance (Feet): 300 Feet Assistive device: None Ambulation/Gait Assistance Details: gait performed with DGI and Berg activites (see balance section of note). Gait Pattern: Step-through pattern Gait velocity: steady cadence at normal, pt able to slow down/speed up with mild veering noted with gait and no loss of balance.      PT Goals Acute Rehab PT Goals PT Goal: Sit to Stand - Progress: Progressing toward goal PT Goal: Stand to Sit - Progress: Progressing toward goal PT Goal: Ambulate - Progress: Progressing toward goal Additional Goals PT Goal: Additional Goal #1 - Progress: Progressing toward  goal  Visit Information  Last PT Received On: 08/15/12 Assistance Needed: +1    Subjective Data  Subjective: No new complaints, agreeable to therapy at this time.   Cognition  Overall Cognitive Status: History of cognitive impairments - at baseline Arousal/Alertness: Awake/alert Orientation Level: Appears intact for tasks assessed Behavior During Session: [facility] for tasks performed    Balance  Dynamic Standing Balance Dynamic Standing - Balance Support: No upper extremity supported Dynamic Standing - Level of Assistance: 5: Stand by assistance;4: Min assist Dynamic Standing - Balance Activities: Forward lean/weight shifting (alt slow marches for SLS stability) Dynamic Standing - Comments: needed assistance for occasional losses of balance with heel-toe rocking to correct post balance loss. Lateral balance loss x1 each side with slow alternating marches with emphasis on SLS stabiltiy/standing. Standardized Balance Assessment Standardized Balance Assessment: Berg Balance Test;Dynamic Gait Index- Only performed sections of each test for balance re-education Berg Balance Test Sit to Stand: Able to stand without using hands and stabilize independently Standing Unsupported: Able to stand 2 minutes with supervision Sitting with Back Unsupported but Feet Supported on Floor or Stool: Able to sit safely and securely 2 minutes Stand to Sit: Sits safely with minimal use of hands Transfers: Able to transfer safely, minor use of hands Dynamic Gait Index Level Surface: Mild Impairment (decreased step length/stride length) Change in Gait Speed: Mild Impairment Gait with Horizontal Head Turns: Mild Impairment Gait with Vertical Head Turns: Normal Gait and Pivot Turn: Mild Impairment Step Over Obstacle: Mild Impairment Step Around Obstacles: Normal Steps:  (not addressed today )   End of Session PT - End of Session Equipment Utilized During Treatment: Gait belt Activity Tolerance: Patient  tolerated treatment well Patient left: in bed;with call bell/phone within  reach;with bed alarm set;with family/visitor present Nurse Communication: Mobility status   GP     Sallyanne Kuster 08/15/2012, 10:51 AM  Sallyanne Kuster, PTA Office- [phone]

## 2012-08-15 NOTE — H&P (View-Only) (Signed)
 PCP: Hodgin Jr, Thomas Whitson, MD  Brief HPI: Patient is a 78-year-old Caucasian male with a past medical history of hypertension, coronary disease status post CABG in the 1990s, dementia who presented to the hospital for evaluation of speech difficulty. Patient lives in assisted living facility, he recently moved to [place] to be close to his daughter. At baseline he did have some amount of speech difficulty. He was scheduled to see a speech therapist who found him with more speech difficulty than usual-apparently patient was having difficulty finding words and expressing himself, so he was referred to the ED for further evaluation. Family was present at bedside, per them this started sometime yesterday afternoon. There was no history of headache, nausea vomiting. There was no history of chest pain or shortness of breath.   Past medical history:  Past Medical History  Diagnosis Date  . Hypertension   . Hyperlipidemia   . CAD (coronary artery disease)   . Pulmonary embolus     Time of CABG  . Dementia   . Depression     Consultants: Neurology  Procedures: TEE on 9/12  Subjective: Patient feels well. Feels his speech is improving. No pain.  Objective: Vital signs in last 24 hours: Temp:  [96.1 F (35.6 C)-98.2 F (36.8 C)] 97.8 F (36.6 C) (09/12 0600) Pulse Rate:  [57-68] 57  (09/12 0600) Resp:  [20] 20  (09/12 0600) BP: (136-185)/(62-96) 159/62 mmHg (09/12 0600) SpO2:  [95 %-100 %] 98 % (09/12 0600) Weight change:     Intake/Output from previous day: 09/11 0701 - 09/12 0700 In: 760 [P.O.:760] Out: -  Intake/Output this shift:    General appearance: alert, cooperative and with difficulty communicating Head: Normocephalic, without obvious abnormality, atraumatic Resp: clear to auscultation bilaterally Cardio: regular rate and rhythm, S1, S2 normal, no murmur, click, rub or gallop GI: soft, non-tender; bowel sounds normal; no masses,  no organomegaly Extremities:  extremities normal, atraumatic, no cyanosis or edema Neurologic: Alert, expressive aphasia. Speech appears to be better. No focal deficits  Lab Results:  Basename 08/13/12 2008 08/13/12 1205  WBC 5.8 5.7  HGB 12.6* 12.6*  HCT 36.4* 36.6*  PLT 196 197   BMET  Basename 08/13/12 2008 08/13/12 1205  NA -- 141  K -- 4.4  CL -- 104  CO2 -- 31  GLUCOSE -- 106*  BUN -- 11  CREATININE 0.79 0.80  CALCIUM -- 9.7  ALT -- 22   2D ECHOCARDIOGRAM Study Conclusions  - Left ventricle: The cavity size was normal. Wall thickness was normal. Systolic function was normal. The estimated ejection fraction was in the range of 50% to 55%. Regional wall motion abnormalities cannot be excluded. Left ventricular diastolic function parameters were normal. - Aortic valve: Trivial regurgitation. - Mitral valve: Calcified annulus.   Studies/Results: Ct Head Wo Contrast  08/13/2012  *RADIOLOGY REPORT*  Clinical Data: Confusion.  CT HEAD WITHOUT CONTRAST  Technique:  Contiguous axial images were obtained from the base of the skull through the vertex without contrast.  Comparison: None.  Findings: There is atrophy and chronic small vessel disease changes.  Physiologic calcifications in the basal ganglia. No acute intracranial abnormality.  Specifically, no hemorrhage, hydrocephalus, mass lesion, acute infarction, or significant intracranial injury.  No acute calvarial abnormality. Visualized paranasal sinuses and mastoids clear.  Orbital soft tissues unremarkable.  Prior old right mastoidectomy.  IMPRESSION: No acute intracranial abnormality.  Atrophy, chronic microvascular disease.   Original Report Authenticated By: KEVIN G. DOVER, M.D.      Mr Mra Head Wo Contrast  08/14/2012  *RADIOLOGY REPORT*  Clinical Data:  Speech difficulty. Dementia.  MRI BRAIN WITHOUT CONTRAST MRA HEAD WITHOUT CONTRAST  Technique: Multiplanar, multiecho pulse sequences of the brain and surrounding structures were obtained according to  standard protocol without intravenous contrast.  Angiographic images of the head were obtained using MRA technique without contrast.  Comparison: 08/13/2012 head CT.  No comparison MR.  MRI HEAD  Findings:  Acute small to slightly moderate size non hemorrhagic infarct mid left frontal lobe.  Prominent small vessel disease type changes.  Tiny areas of blood breakdown products most notable left parietal lobe probably related to result of remote hemorrhagic ischemia.  Global atrophy without hydrocephalus.  No intracranial mass lesion detected on this unenhanced exam.  Prior right mastoidectomy.  Partially empty sella.  Atherosclerotic type changes left vertebral artery.  Please see below.  Minimal paranasal sinus mucosal thickening.  IMPRESSION: Acute small to slightly moderate size non hemorrhagic infarct mid left frontal lobe.  Please see above.  MRA HEAD  Findings: Anterior circulation without medium or large size vessel significant stenosis or occlusion.  Middle cerebral artery mild to moderate branch vessel irregularity.  Moderate irregularity and narrowing of the left vertebral artery. Right vertebral artery is dominant.  Nonvisualization right PICA and left AICA.  No significant stenosis of the basilar artery.  Superior cerebellar artery and posterior cerebral artery branch vessel irregularity bilaterally.  No aneurysm or vascular malformation noted.  IMPRESSION: Intracranial atherosclerotic type changes predominately involving branch vessels and the left vertebral artery as noted above.  This has been made a PRA call report utilizing dashboard call feature.   Original Report Authenticated By: STEVEN R. OLSON, M.D.    Mr Brain Wo Contrast  08/14/2012  *RADIOLOGY REPORT*  Clinical Data:  Speech difficulty. Dementia.  MRI BRAIN WITHOUT CONTRAST MRA HEAD WITHOUT CONTRAST  Technique: Multiplanar, multiecho pulse sequences of the brain and surrounding structures were obtained according to standard protocol without  intravenous contrast.  Angiographic images of the head were obtained using MRA technique without contrast.  Comparison: 08/13/2012 head CT.  No comparison MR.  MRI HEAD  Findings:  Acute small to slightly moderate size non hemorrhagic infarct mid left frontal lobe.  Prominent small vessel disease type changes.  Tiny areas of blood breakdown products most notable left parietal lobe probably related to result of remote hemorrhagic ischemia.  Global atrophy without hydrocephalus.  No intracranial mass lesion detected on this unenhanced exam.  Prior right mastoidectomy.  Partially empty sella.  Atherosclerotic type changes left vertebral artery.  Please see below.  Minimal paranasal sinus mucosal thickening.  IMPRESSION: Acute small to slightly moderate size non hemorrhagic infarct mid left frontal lobe.  Please see above.  MRA HEAD  Findings: Anterior circulation without medium or large size vessel significant stenosis or occlusion.  Middle cerebral artery mild to moderate branch vessel irregularity.  Moderate irregularity and narrowing of the left vertebral artery. Right vertebral artery is dominant.  Nonvisualization right PICA and left AICA.  No significant stenosis of the basilar artery.  Superior cerebellar artery and posterior cerebral artery branch vessel irregularity bilaterally.  No aneurysm or vascular malformation noted.  IMPRESSION: Intracranial atherosclerotic type changes predominately involving branch vessels and the left vertebral artery as noted above.  This has been made a PRA call report utilizing dashboard call feature.   Original Report Authenticated By: STEVEN R. OLSON, M.D.     Medications:  Scheduled:    . aspirin  300   mg Rectal Daily   Or  . aspirin  325 mg Oral Daily  . enoxaparin  40 mg Subcutaneous Q24H  . finasteride  5 mg Oral Daily  . memantine  10 mg Oral BID  . multivitamin with minerals  1 tablet Oral Daily  . rivastigmine  9.5 mg Transdermal Daily  . simvastatin  40 mg  Oral QPM  . DISCONTD: carvedilol  6.25 mg Oral BID WC  . DISCONTD: isosorbide mononitrate  30 mg Oral Daily   Continuous:    . DISCONTD: sodium chloride 50 mL/hr at 08/14/12 0600   PRN:labetalol, ondansetron (ZOFRAN) IV, senna-docusate  Assessment/Plan:  Principal Problem:  *CVA (cerebral infarction) Active Problems:  CAD (coronary artery disease) of artery bypass graft  Hyperlipidemia  Dementia  Hypertension  Altered mental status    CVA (cerebral infarction)-subacute  Patient is doing well and improving. Being followed by neurology. Currently on Aspirin. For TEE on 9/12. Further management depending on TEE. PT/OT/SLP. Carotids show no significantly occlusive disease. ECHO as above. LDL is 49. HBA1C is 5.9. Resume statin on discharge.   History of CAD (coronary artery disease) of artery bypass graft  Stable  Hyperlipidemia  Resume statin at discharge.  Dementia  Seems to be at baseline   Hypertension  Allow for some permissive hypertension. As needed labetalol.   DVT Prophylaxis:  Prophylactic Lovenox   Code Status:  Full code   Family Information At bedside  Disposition Needs home health. Apparently TEE is scheduled for later this afternoon. He may not be able to go home till 9/13.   LOS: 2 days   Pollyann Roa  [facility] Pager [phone] 08/15/2012, 9:40 AM    

## 2012-08-15 NOTE — Op Note (Signed)
Full report to follow 

## 2012-08-15 NOTE — Progress Notes (Signed)
OT Cancellation Note  Treatment cancelled today due to patient receiving procedure or test.  Will re-attempt eval. If pt. Discharges home today, recommend HHOT.  IF pt. Still here tomorrow, then plan to eval in a.m.  Jeani Hawking M [phone] 08/15/2012, 3:10 PM

## 2012-08-15 NOTE — Progress Notes (Signed)
Clinical Social Work: Coverage for 4N  Discussed patient in progression meeting. Patient from Independent Living and patient has no CSW needs at this time.   CM is aware of patient and needs at ILF and PT recommending HH.  Signing off.  Ashley Jacobs, MSW LCSW [phone]

## 2012-08-15 NOTE — Progress Notes (Signed)
Stroke Team Progress Note  HISTORY Samuel Meyers is an 78 y.o. male who lives at a assisted living center and recently moved to [facility]. Patient at baseline has neuro degenerative decline but not formally diagnosed with dementia. He is on the Exelon patch and Nemenda. He also has speech difficulty at baseline and works with SLP at the home. 08/13/2012 it was noted he was having difficulty expressing himself and having paraphasic errors. He was brought to [place] for further evaluation. Patient was not a TPA candidate secondary to presenting outside the window. He was admitted for further evaluation and treatment.  SUBJECTIVE His daughter is at the bedside.  Overall he feels his condition is continuing to improve.   OBJECTIVE Most recent Vital Signs: Filed Vitals:   08/14/12 2300 08/15/12 0200 08/15/12 0600 08/15/12 1012  BP: 169/70 136/67 159/62 165/70  Pulse: 60 68 57 60  Temp: 98.1 F (36.7 C) 97.5 F (36.4 C) 97.8 F (36.6 C) 98.2 F (36.8 C)  TempSrc: Oral Oral Oral Oral  Resp: 20 20 20 20   Weight:      SpO2: 98% 97% 98% 99%   CBG (last 3)  Basename 08/15/12 0822 08/15/12 0329 08/15/12 0100  GLUCAP 94 228* 152*   Intake/Output from previous day: 09/11 0701 - 09/12 0700 In: 760 [P.O.:760] Out: -   IV Fluid Intake:      . DISCONTD: sodium chloride 50 mL/hr at 08/14/12 0600    MEDICATIONS     . aspirin  300 mg Rectal Daily   Or  . aspirin  325 mg Oral Daily  . enoxaparin  40 mg Subcutaneous Q24H  . finasteride  5 mg Oral Daily  . memantine  10 mg Oral BID  . multivitamin with minerals  1 tablet Oral Daily  . rivastigmine  9.5 mg Transdermal Daily  . simvastatin  40 mg Oral QPM  . DISCONTD: carvedilol  6.25 mg Oral BID WC  . DISCONTD: isosorbide mononitrate  30 mg Oral Daily   PRN:  labetalol, ondansetron (ZOFRAN) IV, senna-docusate  Diet:  NPO Activity:  OOB in chair DVT Prophylaxis:  Lovenox 40 mg sq daily   CLINICALLY SIGNIFICANT STUDIES Basic Metabolic  Panel:   Lab 08/13/12 2008 08/13/12 1205  NA -- 141  K -- 4.4  CL -- 104  CO2 -- 31  GLUCOSE -- 106*  BUN -- 11  CREATININE 0.79 0.80  CALCIUM -- 9.7  MG -- --  PHOS -- --   Liver Function Tests:   Lab 08/13/12 1205  AST 27  ALT 22  ALKPHOS 52  BILITOT 0.4  PROT 7.0  ALBUMIN 4.0   CBC:   Lab 08/13/12 2008 08/13/12 1205  WBC 5.8 5.7  NEUTROABS -- 3.6  HGB 12.6* 12.6*  HCT 36.4* 36.6*  MCV 90.8 91.3  PLT 196 197   Coagulation:   Lab 08/13/12 1205  LABPROT 14.5  INR 1.11   Urinalysis:   Lab 08/13/12 1209  COLORURINE YELLOW  LABSPEC 1.009  PHURINE 7.0  GLUCOSEU NEGATIVE  HGBUR NEGATIVE  BILIRUBINUR NEGATIVE  KETONESUR NEGATIVE  PROTEINUR NEGATIVE  UROBILINOGEN 0.2  NITRITE NEGATIVE  LEUKOCYTESUR NEGATIVE   Lipid Panel    Component Value Date/Time   CHOL 118 08/14/2012 0619   TRIG 106 08/14/2012 0619   HDL 48 08/14/2012 0619   CHOLHDL 2.5 08/14/2012 0619   VLDL 21 08/14/2012 0619   LDLCALC 49 08/14/2012 0619   HgbA1C  Lab Results  Component Value Date  HGBA1C 5.7* 08/14/2012   CT of the brain   No acute intracranial abnormality.  Atrophy, chronic microvascular disease.     MRI of the brain  Acute small to slightly moderate size non hemorrhagic infarct mid left frontal lobe.    MRA of the brain  Intracranial atherosclerotic type changes predominately involving branch vessels and the left vertebral artery   2D Echocardiogram  EF 50-55% with no source of embolus.   Carotid Doppler  No internal carotid artery stenosis bilaterally. Vertebrals with antegrade flow bilaterally.   TEE    EKG  normal sinus rhythm, 1st degree AV block.   Therapy Recommendations PT - HH; OT - ; ST - HH  Physical Exam   Pleasant elderly Caucasian male currently not in distress.Awake alert. Afebrile. Head is nontraumatic. Neck is supple without bruit. Hearing is normal. Cardiac exam no murmur or gallop. Lungs are clear to auscultation. Distal pulses are well felt.    Neurological Exam ;Awake alert oriented x2. Significant expressive aphasia with word hesitancy and paraphasic errors. Comprehension appears preserved. Mild difficulty with naming and repetition. Extraocular moments are full range without nystagmus. Blinks to threat bilaterally. Mild right lower facial symmetry. Tongue is midline. Moves about for extra and is quite well. Mild diminished fine finger movements on the right hand. Her extended stem seems symmetric. Gait was not tested. Sensation is intact. Coordination appears normal.  ASSESSMENT [name] is a 78 y.o. male presenting with increased difficulty expressing himself with paraphasic errors. Imaging confirms a medial left frontal lobe infarct. Infarct felt to be embolic secondary to likely atrial fibrillation (unsubstantiated).  Work up underway. On aspirin 325 mg orally every day prior to admission. Now on aspirin 325 mg orally every day for secondary stroke prevention. Patient with resultant ongoing expressive aphasia.   Baseline memory deficit, on namenda and exelon patch  Hypertension  Hylerlipidmia, LDL 49  CAD, CABG  PE at time of CABG  Depression  HgbA1c 5.9  Hospital day # 2  TREATMENT/PLAN  Change to clopidogrel 75 mg orally every day for secondary stroke prevention.  TEE to look for embolic source. Arranged with [facility] for 08/15/2012. If positive for PFO (patent foramen ovale), check bilateral lower extremity venous dopplers to rule out DVT as possible source of stroke.  If TEE negative, please schedule outpatient telemetry monitoring to assess patient for atrial fibrillation as source of stroke. May be arranged with patient's cardiologist, or cardiologist of choice.  HH PT and ST at discharge -D/W Dr Ardith Dark, MSN, RN, ANVP-BC, ANP-BC, GNP-BC [facility] Pager: [phone] 08/15/2012 10:41 AM  Scribe for Dr. Delia Heady, Stroke Center Medical Director, who has  personally reviewed chart, pertinent data, examined the patient and developed the plan of care. Pager:  [phone]

## 2012-08-15 NOTE — Discharge Summary (Signed)
Physician Discharge Summary  Patient ID: Samuel Meyers MRN: [id number] DOB/AGE: December 18, 1933 78 y.o.  Admit date: 08/13/2012 Discharge date: 08/15/2012  PCP: Samuel Cotta, MD  DISCHARGE DIAGNOSES:  Principal Problem:  *CVA (cerebral infarction) Active Problems:  CAD (coronary artery disease) of artery bypass graft  Hyperlipidemia  Dementia  Hypertension  Altered mental status   RECOMMENDATIONS TO PCP: 1. Please make sure patient has received a cardiac monitor to screen for afib.  DISCHARGE CONDITION: fair  INITIAL HISTORY: Patient is a 78 year old Caucasian male with a past medical history of hypertension, coronary disease status post CABG in the 1990s, dementia who presented to the hospital for evaluation of speech difficulty. Patient lives in assisted living facility, he recently moved to [place] to be close to his daughter. At baseline he did have some amount of speech difficulty. He was scheduled to see a speech therapist who found him with more speech difficulty than usual-apparently patient was having difficulty finding words and expressing himself, so he was referred to the ED for further evaluation. Family was present at bedside, per them this started sometime yesterday afternoon. There was no history of headache, nausea vomiting. There was no history of chest pain or shortness of breath.    HOSPITAL COURSE:   Acute CVA  Patient underwent stroke workup in the hospital. Acute stroke was seen in the left frontal lobe. Patient was seen by neurology. They recommended changing him over to Plavix from aspirin. Patient underwent a TEE, which do not show any clots or PFO. He will require outpatient cardiac monitoring, which has been arranged through [facility] cardiology. Carotids show no significantly occlusive disease. ECHO as below. LDL is 49. HBA1C is 5.9. Resume statin on discharge. He was seen by physical and occupational therapy and they recommended home health, which  will be arranged. Patient is still having a lot of difficulty speaking and so was speech and language pathologist will also be arranged.  History of CAD (coronary artery disease) of artery bypass graft  This issue is stable. He may continue to followup with his cardiologist.  Hyperlipidemia  Resume statin at discharge.   Dementia  Seems to be at baseline   Hypertension  Some degree of permissive hypertension was allowed due to the stroke. He may resume his cardiac medications at home.  Overall patient is improved. He still has some speech difficulty. Plavix will be initiated for secondary stroke prevention. He is considered to be stable for discharge. He is status post TEE this afternoon and if his sedation wears off he should, be able to go home by this evening.   2D ECHOCARDIOGRAM  Study Conclusions  - Left ventricle: The cavity size was normal. Wall thickness was normal. Systolic function was normal. The estimated ejection fraction was in the range of 50% to 55%. Regional wall motion abnormalities cannot be excluded. Left ventricular diastolic function parameters were normal. - Aortic valve: Trivial regurgitation. - Mitral valve: Calcified annulus.  IMAGING STUDIES Ct Head Wo Contrast  08/13/2012  *RADIOLOGY REPORT*  Clinical Data: Confusion.  CT HEAD WITHOUT CONTRAST  Technique:  Contiguous axial images were obtained from the base of the skull through the vertex without contrast.  Comparison: None.  Findings: There is atrophy and chronic small vessel disease changes.  Physiologic calcifications in the basal ganglia. No acute intracranial abnormality.  Specifically, no hemorrhage, hydrocephalus, mass lesion, acute infarction, or significant intracranial injury.  No acute calvarial abnormality. Visualized paranasal sinuses and mastoids clear.  Orbital soft  tissues unremarkable.  Prior old right mastoidectomy.  IMPRESSION: No acute intracranial abnormality.  Atrophy, chronic microvascular  disease.   Original Report Authenticated By: Samuel Meyers, M.D.    Mr Samuel Meyers Head Wo Contrast  08/14/2012  *RADIOLOGY REPORT*  Clinical Data:  Speech difficulty. Dementia.  MRI BRAIN WITHOUT CONTRAST MRA HEAD WITHOUT CONTRAST  Technique: Multiplanar, multiecho pulse sequences of the brain and surrounding structures were obtained according to standard protocol without intravenous contrast.  Angiographic images of the head were obtained using MRA technique without contrast.  Comparison: 08/13/2012 head CT.  No comparison MR.  MRI HEAD  Findings:  Acute small to slightly moderate size non hemorrhagic infarct mid left frontal lobe.  Prominent small vessel disease type changes.  Tiny areas of blood breakdown products most notable left parietal lobe probably related to result of remote hemorrhagic ischemia.  Global atrophy without hydrocephalus.  No intracranial mass lesion detected on this unenhanced exam.  Prior right mastoidectomy.  Partially empty sella.  Atherosclerotic type changes left vertebral artery.  Please see below.  Minimal paranasal sinus mucosal thickening.  IMPRESSION: Acute small to slightly moderate size non hemorrhagic infarct mid left frontal lobe.  Please see above.  MRA HEAD  Findings: Anterior circulation without medium or large size vessel significant stenosis or occlusion.  Middle cerebral artery mild to moderate branch vessel irregularity.  Moderate irregularity and narrowing of the left vertebral artery. Right vertebral artery is dominant.  Nonvisualization right PICA and left AICA.  No significant stenosis of the basilar artery.  Superior cerebellar artery and posterior cerebral artery branch vessel irregularity bilaterally.  No aneurysm or vascular malformation noted.  IMPRESSION: Intracranial atherosclerotic type changes predominately involving branch vessels and the left vertebral artery as noted above.  This has been made a PRA call report utilizing dashboard call feature.   Original  Report Authenticated By: Samuel Meyers, M.D.    Mr Brain Wo Contrast  08/14/2012  *RADIOLOGY REPORT*  Clinical Data:  Speech difficulty. Dementia.  MRI BRAIN WITHOUT CONTRAST MRA HEAD WITHOUT CONTRAST  Technique: Multiplanar, multiecho pulse sequences of the brain and surrounding structures were obtained according to standard protocol without intravenous contrast.  Angiographic images of the head were obtained using MRA technique without contrast.  Comparison: 08/13/2012 head CT.  No comparison MR.  MRI HEAD  Findings:  Acute small to slightly moderate size non hemorrhagic infarct mid left frontal lobe.  Prominent small vessel disease type changes.  Tiny areas of blood breakdown products most notable left parietal lobe probably related to result of remote hemorrhagic ischemia.  Global atrophy without hydrocephalus.  No intracranial mass lesion detected on this unenhanced exam.  Prior right mastoidectomy.  Partially empty sella.  Atherosclerotic type changes left vertebral artery.  Please see below.  Minimal paranasal sinus mucosal thickening.  IMPRESSION: Acute small to slightly moderate size non hemorrhagic infarct mid left frontal lobe.  Please see above.  MRA HEAD  Findings: Anterior circulation without medium or large size vessel significant stenosis or occlusion.  Middle cerebral artery mild to moderate branch vessel irregularity.  Moderate irregularity and narrowing of the left vertebral artery. Right vertebral artery is dominant.  Nonvisualization right PICA and left AICA.  No significant stenosis of the basilar artery.  Superior cerebellar artery and posterior cerebral artery branch vessel irregularity bilaterally.  No aneurysm or vascular malformation noted.  IMPRESSION: Intracranial atherosclerotic type changes predominately involving branch vessels and the left vertebral artery as noted above.  This has been made a  PRA call report utilizing dashboard call feature.   Original Report Authenticated By:  Samuel Meyers, M.D.     DISCHARGE EXAMINATION: Please see progress note from earlier today.  DISPOSITION: Home with home health  Discharge Orders    Future Appointments: Provider: Department: Dept Phone: Center:   08/30/2012 1:30 PM Edwyna Perfect, MD [facility] [phone] [facility]     Future Orders Please Complete By Expires   Diet - low sodium heart healthy      Increase activity slowly      Discharge instructions      Comments:   Be sure to follow up with your PCP. You will hear from the [facility] office regarding a heart monitor. If you don't hear anything by Monday please call [phone].     Current Discharge Medication List    START taking these medications   Details  clopidogrel (PLAVIX) 75 MG tablet Take 1 tablet (75 mg total) by mouth daily with breakfast. Qty: 30 tablet, Refills: 2      CONTINUE these medications which have NOT CHANGED   Details  acetaminophen (TYLENOL) 650 MG CR tablet Take 1,300 mg by mouth daily.    carvedilol (COREG) 6.25 MG tablet Take 6.25 mg by mouth 2 (two) times daily with a meal.    finasteride (PROSCAR) 5 MG tablet Take 5 mg by mouth daily.    isosorbide mononitrate (IMDUR) 30 MG 24 hr tablet Take 30 mg by mouth daily.    memantine (NAMENDA) 10 MG tablet Take 10 mg by mouth 2 (two) times daily.    Misc Natural Products (OSTEO BI-FLEX JOINT SHIELD PO) Take 2 tablets by mouth daily.     Multiple Vitamin (MULTIVITAMIN) capsule Take 1 capsule by mouth daily.    Omega-3 Fatty Acids (FP FISH OIL PO) Take 2,400 mg by mouth daily.     rivastigmine (EXELON) 9.5 mg/24hr Place 1 patch onto the skin daily.    simvastatin (ZOCOR) 40 MG tablet Take 40 mg by mouth every evening.      STOP taking these medications     aspirin 325 MG tablet        Follow-up Information    Follow up with Letitia Libra, Ala Dach, MD. Schedule an appointment as soon as possible for a visit in 1 week. (post hospital stay)    Contact information:    [address] [place] [address] [phone]       Follow up with Gates Rigg, MD. Schedule an appointment as soon as possible for a visit in 2 months.   Contact information:   [address], [address] [facility] [place] [address] [phone]          TOTAL DISCHARGE TIME: 40 mins  [facility]  [facility] Pager [phone]  08/15/2012, 4:32 PM

## 2012-08-16 ENCOUNTER — Encounter (HOSPITAL_COMMUNITY): Payer: Self-pay

## 2012-08-16 ENCOUNTER — Encounter (HOSPITAL_COMMUNITY): Payer: Self-pay | Admitting: Internal Medicine

## 2012-08-19 ENCOUNTER — Encounter (INDEPENDENT_AMBULATORY_CARE_PROVIDER_SITE_OTHER): Payer: Medicare Other

## 2012-08-19 DIAGNOSIS — I4891 Unspecified atrial fibrillation: Secondary | ICD-10-CM

## 2012-08-30 ENCOUNTER — Ambulatory Visit (INDEPENDENT_AMBULATORY_CARE_PROVIDER_SITE_OTHER): Payer: Medicare Other | Admitting: Internal Medicine

## 2012-08-30 ENCOUNTER — Encounter: Payer: Self-pay | Admitting: Internal Medicine

## 2012-08-30 VITALS — Ht 69.5 in | Wt 178.2 lb

## 2012-08-30 DIAGNOSIS — F0390 Unspecified dementia without behavioral disturbance: Secondary | ICD-10-CM

## 2012-08-30 DIAGNOSIS — I6350 Cerebral infarction due to unspecified occlusion or stenosis of unspecified cerebral artery: Secondary | ICD-10-CM

## 2012-08-30 DIAGNOSIS — Z23 Encounter for immunization: Secondary | ICD-10-CM

## 2012-08-30 DIAGNOSIS — G4733 Obstructive sleep apnea (adult) (pediatric): Secondary | ICD-10-CM

## 2012-08-30 DIAGNOSIS — I639 Cerebral infarction, unspecified: Secondary | ICD-10-CM

## 2012-08-30 MED ORDER — ISOSORBIDE MONONITRATE ER 30 MG PO TB24: 30.0000 mg | ORAL_TABLET | Freq: Every day | ORAL | Status: DC

## 2012-08-30 MED ORDER — CARVEDILOL 6.25 MG PO TABS: 6.2500 mg | ORAL_TABLET | Freq: Two times a day (BID) | ORAL | Status: DC

## 2012-08-30 MED ORDER — CLOPIDOGREL BISULFATE 75 MG PO TABS: 75.0000 mg | ORAL_TABLET | Freq: Every day | ORAL | Status: DC

## 2012-08-30 MED ORDER — SIMVASTATIN 40 MG PO TABS: 40.0000 mg | ORAL_TABLET | Freq: Every evening | ORAL | Status: DC

## 2012-08-30 NOTE — Assessment & Plan Note (Signed)
No formal diagnosis. Currently using two medication. Neurology consult

## 2012-08-30 NOTE — Progress Notes (Signed)
  Subjective:    Patient ID: Samuel Meyers, male    DOB: May 06, 1934, 78 y.o.   MRN: [id number]  HPI patient presents to clinic to establish care and for hospital followup of stroke. Recently hospitalized for acute left frontal stroke. Has continued speech difficulty with expressive aphasia. Currently receiving physical therapy and speech therapy. Was changed from aspirin to Plavix and tolerates without difficulty. Discharge summary indicates echocardiogram and carotid ultrasound without significant finding. Is currently wearing cardiac monitor to evaluate for occult arrhythmia. Has past known history of CAD currently undergoing risk factor modification. According to his family there is a questionable diagnosis of dementia in the setting of memory loss and was empirically begun on Namenda and Exelon. Denies formal evaluation by neurology for neuropsychological testing. Family notes no improvement with the medication. Has history of OSA treated with CPAP but uses it irregularly due to a problem with his mask. Has not est. with pulmonary or any home equipment company at this time.  Past Medical History  Diagnosis Date  . Hypertension   . Hyperlipidemia   . CAD (coronary artery disease)   . Pulmonary embolus     Time of CABG  . Dementia   . Depression    Past Surgical History  Procedure Date  . Coronary artery bypass graft     [place]  . External ear surgery   . Tonsillectomy   . Tee without cardioversion 08/15/2012    Procedure: TRANSESOPHAGEAL ECHOCARDIOGRAM (TEE);  Surgeon: Pricilla Riffle, MD;  Location: [facility] ENDOSCOPY;  Service: Cardiovascular;  Laterality: N/A;    reports that he has never smoked. He does not have any smokeless tobacco history on file. He reports that he does not drink alcohol or use illicit drugs. family history includes Coronary artery disease in his father. Allergies  Allergen Reactions  . Niacin And Related Nausea And Vomiting  . Wellbutrin (Bupropion) Hives   Gi upset     Review of Systems  Neurological: Positive for speech difficulty.  All other systems reviewed and are negative.       Objective:   Physical Exam  Nursing note and vitals reviewed. Constitutional: He appears well-developed and well-nourished. No distress.  HENT:  Head: Normocephalic and atraumatic.  Right Ear: External ear normal.  Left Ear: External ear normal.  Eyes: Conjunctivae normal are normal. No scleral icterus.  Neck: Neck supple.  Cardiovascular: Normal rate, regular rhythm and normal heart sounds.  Exam reveals no gallop and no friction rub.   No murmur heard. Pulmonary/Chest: Effort normal and breath sounds normal. No respiratory distress. He has no wheezes. He has no rales. He exhibits no tenderness.  Neurological: He is alert.  Skin: Skin is warm and dry. He is not diaphoretic.  Psychiatric: He has a normal mood and affect.          Assessment & Plan:

## 2012-08-30 NOTE — Assessment & Plan Note (Signed)
Contact pulmonary homecare company to assist in mask replacement

## 2012-08-30 NOTE — Assessment & Plan Note (Signed)
Stable. Continue Plavix. Neurology consult. Risk factor modification.

## 2012-08-30 NOTE — Patient Instructions (Signed)
Please schedule fasting labs prior to next visit -cbc, chem7-v58.69, b12-b12 deficiency, lipid/lft-272.4

## 2012-09-03 ENCOUNTER — Telehealth: Payer: Self-pay | Admitting: *Deleted

## 2012-09-03 DIAGNOSIS — G4733 Obstructive sleep apnea (adult) (pediatric): Secondary | ICD-10-CM

## 2012-09-03 NOTE — Telephone Encounter (Signed)
Could you contact [facility]. Pt has osa using cpap and recently moved from [place]. Having difficulty with mask and may need new one. Has no equipment company or pulmonologist currently.  Order entered and faxed to [facility] at [phone]

## 2012-09-18 ENCOUNTER — Ambulatory Visit (INDEPENDENT_AMBULATORY_CARE_PROVIDER_SITE_OTHER): Payer: Medicare Other | Admitting: Cardiology

## 2012-09-18 ENCOUNTER — Encounter: Payer: Self-pay | Admitting: Cardiology

## 2012-09-18 VITALS — BP 140/80 | HR 60 | Wt 178.0 lb

## 2012-09-18 DIAGNOSIS — I25810 Atherosclerosis of coronary artery bypass graft(s) without angina pectoris: Secondary | ICD-10-CM

## 2012-09-18 DIAGNOSIS — E785 Hyperlipidemia, unspecified: Secondary | ICD-10-CM

## 2012-09-18 DIAGNOSIS — I639 Cerebral infarction, unspecified: Secondary | ICD-10-CM

## 2012-09-18 DIAGNOSIS — R079 Chest pain, unspecified: Secondary | ICD-10-CM

## 2012-09-18 DIAGNOSIS — I10 Essential (primary) hypertension: Secondary | ICD-10-CM

## 2012-09-18 DIAGNOSIS — I6350 Cerebral infarction due to unspecified occlusion or stenosis of unspecified cerebral artery: Secondary | ICD-10-CM

## 2012-09-18 NOTE — Assessment & Plan Note (Signed)
Continue statin. 

## 2012-09-18 NOTE — Assessment & Plan Note (Signed)
Symptoms atypical. Will arrange My[facility] for risk stratification.

## 2012-09-18 NOTE — Assessment & Plan Note (Signed)
Blood pressure controlled. Continue present medications. 

## 2012-09-18 NOTE — Progress Notes (Signed)
HPI: pleasant male for fu of coronary disease. He is status post coronary artery bypassing graft in [place] in 1995. Patient had a LIMA to the LAD and a saphenous vein graft to his marginal. He did have postoperative atrial fibrillation and a pulmonary embolus based on outside notes. Cardiac catheterization in 2007 showed patent grafts and normal LV function; normal renal arteries. His most recent functional study was in 2012 and showed normal LV function and probable apical thinning although minimal ischemia could not be excluded. Patient admitted to [facility] in September of 2013 with a CVA. Echocardiogram in September of 2013 showed an ejection fraction of 50-55%, trace aortic insufficiency. Transesophageal echocardiogram in September of 2013 showed normal LV function and trace aortic and mitral regurgitation. Carotid Dopplers in September of 2013 showed no significant extracranial carotid artery stenosis. Outpatient monitor showed sinus with occasional PVCs. Since he was discharged, he has had occasional chest pain which apparently has been a problem since his bypass surgery. It can last for 1 minute to one hour. It occurs with certain movements. No exertional chest pain and no dyspnea. He is gradually recovering from his CVA.  Current Outpatient Prescriptions  Medication Sig Dispense Refill  . acetaminophen (TYLENOL) 650 MG CR tablet Take 1,300 mg by mouth daily.      . carvedilol (COREG) 6.25 MG tablet Take 1 tablet (6.25 mg total) by mouth 2 (two) times daily with a meal.  180 tablet  2  . cholecalciferol (VITAMIN D) 400 UNITS TABS Take 400 Units by mouth daily.      . clopidogrel (PLAVIX) 75 MG tablet Take 1 tablet (75 mg total) by mouth daily with breakfast.  90 tablet  2  . Cyanocobalamin (VITAMIN B-12 CR) 1500 MCG TBCR Take by mouth daily.      . finasteride (PROSCAR) 5 MG tablet Take 5 mg by mouth daily.      . isosorbide mononitrate (IMDUR) 30 MG 24 hr tablet Take 1 tablet  (30 mg total) by mouth daily.  90 tablet  2  . memantine (NAMENDA) 10 MG tablet Take 10 mg by mouth 2 (two) times daily.      . Misc Natural Products (OSTEO BI-FLEX JOINT SHIELD PO) Take 2 tablets by mouth daily.       . Multiple Vitamin (MULTIVITAMIN) capsule Take 1 capsule by mouth daily.      . Omega-3 Fatty Acids (FP FISH OIL PO) Take 2,400 mg by mouth daily.       . rivastigmine (EXELON) 9.5 mg/24hr Place 1 patch onto the skin daily.      . simvastatin (ZOCOR) 40 MG tablet Take 1 tablet (40 mg total) by mouth every evening.  90 tablet  2  . vitamin C (ASCORBIC ACID) 500 MG tablet Take 500 mg by mouth daily.         Past Medical History  Diagnosis Date  . Hypertension   . Hyperlipidemia   . CAD (coronary artery disease)   . Pulmonary embolus     Time of CABG  . Dementia   . Depression     Past Surgical History  Procedure Date  . Coronary artery bypass graft     [place]  . External ear surgery   . Tonsillectomy   . Tee without cardioversion 08/15/2012    Procedure: TRANSESOPHAGEAL ECHOCARDIOGRAM (TEE);  Surgeon: Pricilla Riffle, MD;  Location: [facility] ENDOSCOPY;  Service: Cardiovascular;  Laterality: N/A;    History   Social  History  . Marital Status: Married    Spouse Name: N/A    Number of Children: 1  . Years of Education: N/A   Occupational History  .      Retired   Social History Main Topics  . Smoking status: Never Smoker   . Smokeless tobacco: Not on file  . Alcohol Use: No  . Drug Use: No  . Sexually Active: Not on file   Other Topics Concern  . Not on file   Social History Narrative  . No narrative on file    ROS: no fevers or chills, productive cough, hemoptysis, dysphasia, odynophagia, melena, hematochezia, dysuria, hematuria, rash, seizure activity, orthopnea, PND, pedal edema, claudication. Remaining systems are negative.  Physical Exam: Well-developed well-nourished in no acute distress.  Skin is warm and dry.  HEENT is normal.  Neck is  supple.  Chest is clear to auscultation with normal expansion. Previous sternotomy Cardiovascular exam is regular rate and rhythm.  Abdominal exam nontender or distended. No masses palpated. Extremities show no edema. neuro shows residual dysarthria and mild weakness on left side.  Electrocardiogram-sinus rhythm with first degree AV block. Slight inferolateral ST elevation unchanged compared to previous. Question early repolarization. Note the patient is not having chest pain at time of evaluation.

## 2012-09-18 NOTE — Assessment & Plan Note (Signed)
Patient placed on Plavix in the hospital and aspirin discontinued. Continue rehabilitation.

## 2012-09-18 NOTE — Patient Instructions (Addendum)
Your physician wants you to follow-up in: 6 MONTHS WITH DR Jens Som IN [place] You will receive a reminder letter in the mail two months in advance. If you don't receive a letter, please call our office to schedule the follow-up appointment.   Your physician has requested that you have a lexiscan myoview. For further information please visit [email or web address] Please follow instruction sheet, as given.

## 2012-09-18 NOTE — Assessment & Plan Note (Signed)
-   Continue Plavix and statin 

## 2012-09-20 ENCOUNTER — Telehealth: Payer: Self-pay | Admitting: Internal Medicine

## 2012-09-20 NOTE — Telephone Encounter (Signed)
NEEDS REFERRAL TO THE HEARING CLINIC [address] [place]  HE ALREADY HAS AN APPOINTMENT FOR Monday AT 100  FAX [phone]

## 2012-09-21 ENCOUNTER — Other Ambulatory Visit: Payer: Self-pay | Admitting: Internal Medicine

## 2012-09-21 DIAGNOSIS — H9190 Unspecified hearing loss, unspecified ear: Secondary | ICD-10-CM

## 2012-09-24 ENCOUNTER — Institutional Professional Consult (permissible substitution): Payer: Medicare Other | Admitting: Pulmonary Disease

## 2012-09-30 ENCOUNTER — Ambulatory Visit (HOSPITAL_COMMUNITY): Payer: Medicare Other | Attending: Cardiology | Admitting: Radiology

## 2012-09-30 VITALS — BP 167/94 | Ht 70.0 in | Wt 178.0 lb

## 2012-09-30 DIAGNOSIS — R079 Chest pain, unspecified: Secondary | ICD-10-CM | POA: Insufficient documentation

## 2012-09-30 DIAGNOSIS — I2510 Atherosclerotic heart disease of native coronary artery without angina pectoris: Secondary | ICD-10-CM

## 2012-09-30 DIAGNOSIS — Z8249 Family history of ischemic heart disease and other diseases of the circulatory system: Secondary | ICD-10-CM | POA: Insufficient documentation

## 2012-09-30 DIAGNOSIS — I25810 Atherosclerosis of coronary artery bypass graft(s) without angina pectoris: Secondary | ICD-10-CM

## 2012-09-30 DIAGNOSIS — I4891 Unspecified atrial fibrillation: Secondary | ICD-10-CM | POA: Insufficient documentation

## 2012-09-30 DIAGNOSIS — I10 Essential (primary) hypertension: Secondary | ICD-10-CM | POA: Insufficient documentation

## 2012-09-30 MED ORDER — TECHNETIUM TC 99M SESTAMIBI GENERIC - CARDIOLITE
11.0000 | Freq: Once | INTRAVENOUS | Status: AC | PRN
  Administered 2012-09-30: 11 via INTRAVENOUS

## 2012-09-30 MED ORDER — REGADENOSON 0.4 MG/5ML IV SOLN
0.4000 mg | Freq: Once | INTRAVENOUS | Status: AC
  Administered 2012-09-30: 0.4 mg via INTRAVENOUS

## 2012-09-30 MED ORDER — TECHNETIUM TC 99M SESTAMIBI GENERIC - CARDIOLITE
33.0000 | Freq: Once | INTRAVENOUS | Status: AC | PRN
  Administered 2012-09-30: 33 via INTRAVENOUS

## 2012-09-30 NOTE — Progress Notes (Signed)
[facility] SITE 3 NUCLEAR MED [address] [id number] [place] [address] [phone]  Cardiology Nuclear Med Study  Samuel Meyers is a 78 y.o. male     MRN : [id number]     DOB: 03/12/1934  Procedure Date: 09/30/2012  Nuclear Med Background Indication for Stress Test:  Evaluation for Ischemia, Post Hospital:9/13 with CVA and Graft Patency History:  '95 CABGx2;A-Fib post CABG;'07 Heart Catheterization:patent grafts and LVF;'12 MPS: EF= 59%,normal,LVF,probably apical thinning,min. ischemia can't be excluded;9/13 Echo; EF=50-55% Cardiac Risk Factors: CVA, Family History - CAD, History of Smoking, Hypertension and Lipids  Symptoms:  Chest Pain with Exertion (last date of chest discomfort 1 week ago) and Fatigue   Nuclear Pre-Procedure Caffeine/Decaff Intake:  None NPO After: 7:00am   Lungs:  clear O2 Sat: 97% on room air. IV 0.9% NS with Angio Cath:  22g  IV Site: R Forearm  IV Started by:  Stanton Kidney, EMT-P  Chest Size (in):  40 Cup Size: n/a  Height: 5\' 10"  (1.778 m)  Weight:  178 lb (80.74 kg)  BMI:  Body mass index is 25.54 kg/(m^2). Tech Comments:  n/a    Nuclear Med Study 1 or 2 day study: 1 day  Stress Test Type:  Lexiscan  Reading MD: Cassell Clement, MD  Order Authorizing Provider:  Ripley Fraise  Resting Radionuclide: Technetium 12m Sestamibi  Resting Radionuclide Dose: 11.0 mCi   Stress Radionuclide:  Technetium 40m Sestamibi  Stress Radionuclide Dose: 33.0 mCi           Stress Protocol Rest HR: 55 Stress HR: 76  Rest BP: 167/94 Stress BP: 157/79  Exercise Time (min): n/a METS: n/a   Predicted Max HR: 144 bpm % Max HR: 51.39 bpm Rate Pressure Product: 95638   Dose of Adenosine (mg):  n/a Dose of Lexiscan: 0.4 mg  Dose of Atropine (mg): n/a Dose of Dobutamine: n/a mcg/kg/min (at max HR)  Stress Test Technologist: Cathlyn Parsons, RN  Nuclear Technologist:  Domenic Polite, CNMT     Rest Procedure:  Myocardial perfusion  imaging was performed at rest 45 minutes following the intravenous administration of Technetium 63m Tetrofosmin. Rest ECG: Sinus Bradycardia with first degree AV block  Stress Procedure:  The patient received IV Lexiscan 0.4 mg over 15-seconds.  Technetium 41m Tetrofosmin injected at 30-seconds.  There were no significant changes with Lexiscan.  Quantitative spect images were obtained after a 45 minute delay. Stress ECG: No significant change from baseline ECG  QPS Raw Data Images:  Normal; no motion artifact; normal heart/lung ratio. Stress Images:  Normal homogeneous uptake in all areas of the myocardium. Rest Images:  Normal homogeneous uptake in all areas of the myocardium. Subtraction (SDS):  No evidence of ischemia. Transient Ischemic Dilatation (Normal <1.22):  1.08 Lung/Heart Ratio (Normal <0.45):  0.30  Quantitative Gated Spect Images QGS EDV:  130 ml QGS ESV:  52 ml  Impression Exercise Capacity:  Lexiscan with no exercise. BP Response:  Normal blood pressure response. Clinical Symptoms:  No significant symptoms noted. ECG Impression:  No significant ST segment change suggestive of ischemia. Comparison with Prior Nuclear Study: No images to compare  Overall Impression:  Normal stress nuclear study.  LV Ejection Fraction: 60%.  LV Wall Motion:  NL LV Function; NL Wall Motion   [employer]

## 2012-10-02 NOTE — Addendum Note (Signed)
Addended by: Reine Just on: 10/02/2012 03:12 PM   Modules accepted: Orders

## 2012-10-08 ENCOUNTER — Ambulatory Visit (INDEPENDENT_AMBULATORY_CARE_PROVIDER_SITE_OTHER): Payer: Medicare Other | Admitting: Pulmonary Disease

## 2012-10-08 ENCOUNTER — Encounter: Payer: Self-pay | Admitting: Pulmonary Disease

## 2012-10-08 ENCOUNTER — Telehealth: Payer: Self-pay | Admitting: *Deleted

## 2012-10-08 VITALS — BP 140/80 | HR 73 | Temp 97.7°F | Ht 70.0 in | Wt 178.0 lb

## 2012-10-08 DIAGNOSIS — G4733 Obstructive sleep apnea (adult) (pediatric): Secondary | ICD-10-CM

## 2012-10-08 NOTE — Patient Instructions (Addendum)
We will obtain records from dr Jimmie Molly & DME & get you started with cpap supplies

## 2012-10-08 NOTE — Progress Notes (Signed)
Subjective:    Patient ID: Samuel Meyers, male    DOB: 07-04-1934, 78 y.o.   MRN: [id number]  HPI 78 year old with diagnosed OSA who [place], presents for her evaluation and management. He is accompanied by his wife and daughter He has hypertension, hyperlipidemia, coronary artery disease status post CABG and a recent stroke with residual aphasia. Sleep study in 9/10 showed moderate OSA with AHI 16/h, RDI 24/h with desatn to 84%. Most of the events occurred while supine. Positional therapy was tried initially  After a cpap titration study (required upto 16 cm ) he was placed on Autoset 8-16 cm, last download 5/13 shows good compliance > 8h, avg pr 12 cm, no residuals Epworth sleepiness score is 10/24. He has been maintained on CPAP since 2011 and now needs  Supplies. Bedtime is 8 PM to 9 PM, sleep latency is 15-30 minutes, he is 5-6 spontaneous awakenings and is out of bed by 6 AM. He has always been an early riser. Wife reports napping episodes in his recliner. He has been having increasing memory deficits and does not clear whether this is due to his stroke, dementia or untreated sleep apnea due to lack of CPAP supplies. He has undergone neurological evaluation. There is no history suggestive of cataplexy, sleep paralysis or parasomnias He denies alcohol intake or excessive caffeinated beverages  Past Medical History  Diagnosis Date  . Hypertension   . Hyperlipidemia   . CAD (coronary artery disease)   . Pulmonary embolus     Time of CABG  . Dementia   . Depression   . Stroke     2013--Sept    Past Surgical History  Procedure Date  . Coronary artery bypass graft     [place]  . External ear surgery   . Tonsillectomy   . Tee without cardioversion 08/15/2012    Procedure: TRANSESOPHAGEAL ECHOCARDIOGRAM (TEE);  Surgeon: Pricilla Riffle, MD;  Location: [facility] ENDOSCOPY;  Service: Cardiovascular;  Laterality: N/A;    Allergies  Allergen  Reactions  . Niacin And Related Nausea And Vomiting  . Wellbutrin (Bupropion) Hives    Gi upset    History   Social History  . Marital Status: Married    Spouse Name: N/A    Number of Children: 1  . Years of Education: N/A   Occupational History  . security     Retired   Social History Main Topics  . Smoking status: Never Smoker   . Smokeless tobacco: Never Used  . Alcohol Use: No  . Drug Use: No  . Sexually Active: Not on file   Other Topics Concern  . Not on file   Social History Narrative  . No narrative on file     Review of Systems  Constitutional: Negative for fever and unexpected weight change.  HENT: Negative for ear pain, nosebleeds, congestion, sore throat, rhinorrhea, sneezing, trouble swallowing, dental problem, postnasal drip and sinus pressure.   Eyes: Negative for redness and itching.  Respiratory: Negative for cough, chest tightness, shortness of breath and wheezing.   Cardiovascular: Negative for palpitations and leg swelling.  Gastrointestinal: Negative for nausea and vomiting.  Genitourinary: Negative for dysuria.  Musculoskeletal: Negative for joint swelling.  Skin: Negative for rash.  Neurological: Negative for headaches.  Hematological: Does not bruise/bleed easily.  Psychiatric/Behavioral: Positive for sleep disturbance. Negative for dysphoric mood. The patient is not nervous/anxious.        Objective:   Physical  Exam  Gen. Pleasant, well-nourished, in no distress, anxious affect ENT - no lesions, no post nasal drip Neck: No JVD, no thyromegaly, no carotid bruits Lungs: no use of accessory muscles, no dullness to percussion, clear without rales or rhonchi  Cardiovascular: Rhythm regular, heart sounds  normal, no murmurs or gallops, no peripheral edema Abdomen: soft and non-tender, no hepatosplenomegaly, BS normal. Musculoskeletal: No deformities, no cyanosis or clubbing Neuro:  alert, lt facial droop, power 5/5 all 4 Es, word  aphasia       Assessment & Plan:

## 2012-10-08 NOTE — Telephone Encounter (Signed)
Per OV note on 10/08/12--Dr. Vassie Loll requesting medical records form Dr. Jimmie Molly and patient DME company in [place] --->   [employer]  [address] [place] [address] Phone: [phone] Fax: [phone]  Records from Dr. Glory Rosebush office have been received and given to Dr. Vassie Loll, have also requested his previous DME company to send over records they have to [facility] office.  Awaiting information from DME company, will send to [facility] as FYI

## 2012-10-08 NOTE — Telephone Encounter (Signed)
[facility] has received DME paperwork and given to Dr. Vassie Loll.  Will close message.

## 2012-10-09 NOTE — Assessment & Plan Note (Signed)
Moderate OSA - RDI 24/h on auto 8-16 cm Recent downward and 2013 showed good control of events in good compliance. We will set him up with a new DME and get him CPAP supplies. We will reassess with the download report in 2 months. Hopefully once he is more compliant with his CPAP, his memory issues will be better and this will assist with this further neurologic evaluation for stroke and dementia. I will continue auto settings for now but it does seem like he needs an average pressure of 12 cm.  Weight loss encouraged, compliance with goal of at least 4-6 hrs every night is the expectation. Advised against medications with sedative side effects Cautioned against driving when sleepy - understanding that sleepiness will vary on a day to day basis His extensive records from [place] were reviewed.

## 2012-10-15 ENCOUNTER — Telehealth: Payer: Self-pay | Admitting: Pulmonary Disease

## 2012-10-15 NOTE — Telephone Encounter (Signed)
ATC [name] at [phone] no answer. LMOMTCB

## 2012-10-15 NOTE — Telephone Encounter (Signed)
Pt's daughter Jeanette Caprice) called.  She stated her dad really needs to get a CPAP bc he is not sleeping.  She would like for [place] to call her @  [phone]. Leanora Ivanoff

## 2012-10-16 NOTE — Telephone Encounter (Signed)
I have received download on pt. I spoke with [place] and is aware of RA recs. I have also sent recent download over to her that we received as she requested. Will hold in my box to check on status of pt cpap

## 2012-10-16 NOTE — Telephone Encounter (Signed)
I have called for previous download:   [employer]  [address]  [place] [address]  Phone: [phone]  Fax: [phone]   They will fax this to triage fax #. Will await fax

## 2012-10-16 NOTE — Telephone Encounter (Signed)
I have not received anything on this pt yet. Pl have them get him a  cpap - Paperwork can be filled out later

## 2012-10-16 NOTE — Telephone Encounter (Signed)
Pt's daughter Eunice Blase) returned call Leanora Ivanoff

## 2012-10-16 NOTE — Telephone Encounter (Signed)
Lazarus Salines, RN 10/08/2012 4:57 PM Signed  Blinda Leatherwood has received DME paperwork and given to Dr. Vassie Loll. Will close message  I spoke with daughter and is aware we are working on getting pt download from previous supplier. Dr. Vassie Loll do you still have download so we can send to [place]. thanks

## 2012-10-17 ENCOUNTER — Telehealth: Payer: Self-pay | Admitting: Pulmonary Disease

## 2012-10-17 NOTE — Telephone Encounter (Signed)
I do not need any downloads on this pt , I have reviewed all his records- please get him a cPAP as ordered ASAP

## 2012-10-17 NOTE — Telephone Encounter (Signed)
I spoke with Samuel Meyers and she stated she did received fax and [facility] is processing this now. Pt will be contacted once medicare approves this. I called and made [name] aware of this. She voiced her understanding and needed nothing further

## 2012-10-18 NOTE — Telephone Encounter (Signed)
LMOAM for [place]cretia with [facility] to check on status of referral and advise. [name]

## 2012-10-18 NOTE — Telephone Encounter (Signed)
Please advise pcc's thanks 

## 2012-10-18 NOTE — Telephone Encounter (Signed)
Spoke with [place] at [facility] and patient has been set up for CPAP set up on Tues 10/22/12. [facility] will be setting this equipment up in pt's home and pt is aware of appointment. [name]

## 2012-10-23 ENCOUNTER — Telehealth: Payer: Self-pay | Admitting: Cardiology

## 2012-10-23 NOTE — Telephone Encounter (Signed)
plz return call to [facility]- Dr. Craige Cotta [phone] regarding heart monitor results.

## 2012-10-23 NOTE — Telephone Encounter (Signed)
Spoke with dr Evlyn Courier office, they requested results of event monitor to be faxed to them at [phone].

## 2012-11-08 ENCOUNTER — Telehealth: Payer: Self-pay | Admitting: *Deleted

## 2012-11-08 DIAGNOSIS — Z79899 Other long term (current) drug therapy: Secondary | ICD-10-CM

## 2012-11-08 DIAGNOSIS — E785 Hyperlipidemia, unspecified: Secondary | ICD-10-CM

## 2012-11-08 DIAGNOSIS — E538 Deficiency of other specified B group vitamins: Secondary | ICD-10-CM

## 2012-11-08 LAB — HEPATIC FUNCTION PANEL
AST: 24 U/L (ref 0–37)
Albumin: 4.7 g/dL (ref 3.5–5.2)
Total Bilirubin: 0.7 mg/dL (ref 0.3–1.2)

## 2012-11-08 LAB — BASIC METABOLIC PANEL
BUN: 16 mg/dL (ref 6–23)
Calcium: 9.4 mg/dL (ref 8.4–10.5)
Creat: 0.9 mg/dL (ref 0.50–1.35)
Glucose, Bld: 93 mg/dL (ref 70–99)
Potassium: 4.8 mEq/L (ref 3.5–5.3)

## 2012-11-08 LAB — LIPID PANEL
Cholesterol: 119 mg/dL (ref 0–200)
Total CHOL/HDL Ratio: 2.8 Ratio
Triglycerides: 87 mg/dL (ref <150)

## 2012-11-08 NOTE — Addendum Note (Signed)
Addended by: Mervin Kung A on: 11/08/2012 03:02 PM   Modules accepted: Orders

## 2012-11-08 NOTE — Telephone Encounter (Signed)
Per phlebotomist, CBC not obtained today. She was unable to get in touch with pt but will try to contact him on Monday to return for CBC.  CBC cancelled from today's orders and re-entered as future for Monday.

## 2012-11-08 NOTE — Telephone Encounter (Signed)
Pt presented to the lab. Orders entered per 08/30/12 office note as below:  Please schedule fasting labs prior to next visit -cbc, chem7-v58.69, b12-b12 deficiency, lipid/lft-272.4

## 2012-11-09 LAB — VITAMIN B12: Vitamin B-12: 725 pg/mL (ref 211–911)

## 2012-11-15 ENCOUNTER — Other Ambulatory Visit: Payer: Self-pay | Admitting: Internal Medicine

## 2012-11-15 ENCOUNTER — Ambulatory Visit: Payer: Self-pay | Admitting: Internal Medicine

## 2012-11-15 ENCOUNTER — Ambulatory Visit (INDEPENDENT_AMBULATORY_CARE_PROVIDER_SITE_OTHER): Payer: Medicare Other | Admitting: Internal Medicine

## 2012-11-15 ENCOUNTER — Other Ambulatory Visit: Payer: Self-pay | Admitting: *Deleted

## 2012-11-15 ENCOUNTER — Telehealth: Payer: Self-pay | Admitting: Internal Medicine

## 2012-11-15 ENCOUNTER — Encounter: Payer: Self-pay | Admitting: Internal Medicine

## 2012-11-15 VITALS — BP 128/82 | HR 55 | Temp 98.0°F | Resp 14 | Wt 182.0 lb

## 2012-11-15 DIAGNOSIS — Z79899 Other long term (current) drug therapy: Secondary | ICD-10-CM

## 2012-11-15 DIAGNOSIS — I10 Essential (primary) hypertension: Secondary | ICD-10-CM

## 2012-11-15 DIAGNOSIS — I6350 Cerebral infarction due to unspecified occlusion or stenosis of unspecified cerebral artery: Secondary | ICD-10-CM

## 2012-11-15 DIAGNOSIS — I639 Cerebral infarction, unspecified: Secondary | ICD-10-CM

## 2012-11-15 DIAGNOSIS — E785 Hyperlipidemia, unspecified: Secondary | ICD-10-CM

## 2012-11-15 DIAGNOSIS — E538 Deficiency of other specified B group vitamins: Secondary | ICD-10-CM

## 2012-11-15 NOTE — Progress Notes (Signed)
Prior labs done 12.06.13; only needed CBC w/diff today/SLS

## 2012-11-15 NOTE — Telephone Encounter (Signed)
Spoke with pt wife and dtr, the pt is having a dull discomfort in his chest, off and on. He relates the discomfort to changes in position and when he turns his body. Reassurance given that is sounds like a muscle pain. Okay given for pt to swim.

## 2012-11-15 NOTE — Telephone Encounter (Signed)
Patient states the area around his heart is still hurting.  Off and on since the stress test two to three times per week.  Does he need to come in and see Dr Jens Som again?  Patient would also like to know if he can be cleared to swim at [place] where he lives.  Please call daughter

## 2012-11-16 LAB — CBC WITH DIFFERENTIAL/PLATELET
Basophils Absolute: 0 10*3/uL (ref 0.0–0.1)
Basophils Relative: 1 % (ref 0–1)
Eosinophils Absolute: 0.1 10*3/uL (ref 0.0–0.7)
Lymphs Abs: 1.3 10*3/uL (ref 0.7–4.0)
MCH: 31.2 pg (ref 26.0–34.0)
Neutrophils Relative %: 61 % (ref 43–77)
Platelets: 223 10*3/uL (ref 150–400)
RBC: 4.17 MIL/uL — ABNORMAL LOW (ref 4.22–5.81)

## 2012-11-16 NOTE — Assessment & Plan Note (Signed)
Stable. Has continued expressive aphasia.

## 2012-11-16 NOTE — Assessment & Plan Note (Signed)
Normotensive and stable. Continue current regimen. Monitor bp as outpt and followup in clinic as scheduled.  

## 2012-11-16 NOTE — Progress Notes (Signed)
  Subjective:    Patient ID: Samuel Meyers, male    DOB: November 16, 1934, 78 y.o.   MRN: [id number]  HPI Pt presents to clinic for followup of multiple medical problems. History assisted by his daughter. Continues with some degree of expressive aphasia after his stroke but overall seems to be doing well. Is interested in resuming exercise/swimming. Seeing urology for elevated psa with urine test pending. BP reviewed as normotensive.   Past Medical History  Diagnosis Date  . Hypertension   . Hyperlipidemia   . CAD (coronary artery disease)   . Pulmonary embolus     Time of CABG  . Dementia   . Depression   . Stroke     2013--Sept   Past Surgical History  Procedure Date  . Coronary artery bypass graft     [place]  . External ear surgery   . Tonsillectomy   . Tee without cardioversion 08/15/2012    Procedure: TRANSESOPHAGEAL ECHOCARDIOGRAM (TEE);  Surgeon: Pricilla Riffle, MD;  Location: [facility] ENDOSCOPY;  Service: Cardiovascular;  Laterality: N/A;    reports that he has never smoked. He has never used smokeless tobacco. He reports that he does not drink alcohol or use illicit drugs. family history includes Coronary artery disease in his father. Allergies  Allergen Reactions  . Niacin And Related Nausea And Vomiting  . Wellbutrin (Bupropion) Hives    Gi upset      Review of Systems see hpi     Objective:   Physical Exam  Physical Exam  Nursing note and vitals reviewed. Constitutional: Appears well-developed and well-nourished. No distress.  HENT:  Head: Normocephalic and atraumatic.  Right Ear: External ear normal.  Left Ear: External ear normal.  Eyes: Conjunctivae are normal. No scleral icterus.  Neck: Neck supple. Carotid bruit is not present.  Cardiovascular: Normal rate, regular rhythm and normal heart sounds.  Exam reveals no gallop and no friction rub.   No murmur heard. Pulmonary/Chest: Effort normal and breath sounds normal. No respiratory distress. He has no  wheezes. no rales.  Lymphadenopathy:    He has no cervical adenopathy.  Neurological:Alert.  Skin: Skin is warm and dry. Not diaphoretic.  Psychiatric: Has a normal mood and affect.        Assessment & Plan:

## 2012-11-25 ENCOUNTER — Ambulatory Visit: Payer: Medicare Other | Admitting: Internal Medicine

## 2013-01-07 ENCOUNTER — Encounter: Payer: Self-pay | Admitting: Pulmonary Disease

## 2013-01-07 ENCOUNTER — Ambulatory Visit (INDEPENDENT_AMBULATORY_CARE_PROVIDER_SITE_OTHER): Payer: Medicare Other | Admitting: Pulmonary Disease

## 2013-01-07 VITALS — BP 142/80 | HR 57 | Temp 97.5°F | Ht 70.0 in | Wt 187.0 lb

## 2013-01-07 DIAGNOSIS — G4733 Obstructive sleep apnea (adult) (pediatric): Secondary | ICD-10-CM

## 2013-01-07 NOTE — Progress Notes (Signed)
  Subjective:    Patient ID: Samuel Meyers, male    DOB: 1934/06/15, 79 y.o.   MRN: [id number]  HPI 79 year old with diagnosed OSA who [place] just [place], presents for evaluation and management. He is accompanied by his wife and daughter He has hypertension, hyperlipidemia, coronary artery disease status post CABG and a recent stroke with residual aphasia. Sleep study in 9/10 showed moderate OSA with AHI 16/h, RDI 24/h with desatn to 84%. Most of the events occurred while supine. Positional therapy was tried initially  After a cpap titration study (required upto 16 cm ) he was placed on Autoset 8-16 cm, last download 5/13 shows good compliance > 8h, avg pr 12 cm, no residuals Epworth sleepiness score is 10/24. He has been maintained on CPAP since 2011 and now needs  Supplies.    01/07/2013 Set up with [facility], remains on auto Currently using CPAP every night for 10 hours. Denies any problems the mask or machine. Much improved- better rested, mask ok, pressure ok, no dryness Memory improved Bedtime is 8 PM to 9 PM, sleep latency is 15-30 minutes, he is 5-6 spontaneous awakenings and is out of bed by 6 AM. He has always been an early riser. Wife reports napping episodes in his recliner. He has been having increasing memory deficits and does not clear whether this is due to his stroke, dementia or untreated sleep apnea due to lack of CPAP supplies. He has undergone neurological evaluation. There is no history suggestive of cataplexy, sleep paralysis or parasomnias He denies alcohol intake or excessive caffeinated beverages   Review of Systems neg for any significant sore throat, dysphagia, itching, sneezing, nasal congestion or excess/ purulent secretions, fever, chills, sweats, unintended wt loss, pleuritic or exertional cp, hempoptysis, orthopnea pnd or change in chronic leg swelling. Also denies presyncope, palpitations, heartburn, abdominal pain, nausea, vomiting,  diarrhea or change in bowel or urinary habits, dysuria,hematuria, rash, arthralgias, visual complaints, headache, numbness weakness or ataxia.     Objective:   Physical Exam  Gen. Pleasant, well-nourished, in no distress ENT - no lesions, no post nasal drip Neck: No JVD, no thyromegaly, no carotid bruits Lungs: no use of accessory muscles, no dullness to percussion, clear without rales or rhonchi  Cardiovascular: Rhythm regular, heart sounds  normal, no murmurs or gallops, no peripheral edema Musculoskeletal: No deformities, no cyanosis or clubbing         Assessment & Plan:

## 2013-01-07 NOTE — Assessment & Plan Note (Signed)
Moderate OSA - RDI 24/h on auto 8-16 cm Change filters every 3 months Get supplies as needed Chk download   Weight loss encouraged, compliance with goal of at least 4-6 hrs every night is the expectation. Advised against medications with sedative side effects Cautioned against driving when sleepy - understanding that sleepiness will vary on a day to day basis

## 2013-01-07 NOTE — Patient Instructions (Addendum)
Change filters every 3 months Get supplies as needed

## 2013-01-23 ENCOUNTER — Telehealth: Payer: Self-pay | Admitting: Pulmonary Disease

## 2013-01-23 NOTE — Telephone Encounter (Signed)
Download 2/14 on auto 8-16 shows good compliance> 8h, avg pr 14 cm, no residuals Stay on same settings

## 2013-01-24 NOTE — Telephone Encounter (Signed)
I spoke with patient about results and he verbalized understanding and had no questions 

## 2013-02-05 ENCOUNTER — Encounter: Payer: Self-pay | Admitting: Pulmonary Disease

## 2013-03-05 ENCOUNTER — Ambulatory Visit (INDEPENDENT_AMBULATORY_CARE_PROVIDER_SITE_OTHER): Payer: Medicare Other | Admitting: Cardiology

## 2013-03-05 ENCOUNTER — Encounter: Payer: Self-pay | Admitting: Cardiology

## 2013-03-05 VITALS — BP 138/80 | HR 56 | Wt 181.0 lb

## 2013-03-05 DIAGNOSIS — E785 Hyperlipidemia, unspecified: Secondary | ICD-10-CM

## 2013-03-05 DIAGNOSIS — I25810 Atherosclerosis of coronary artery bypass graft(s) without angina pectoris: Secondary | ICD-10-CM

## 2013-03-05 DIAGNOSIS — I10 Essential (primary) hypertension: Secondary | ICD-10-CM

## 2013-03-05 MED ORDER — ISOSORBIDE MONONITRATE ER 30 MG PO TB24: 30.0000 mg | ORAL_TABLET | Freq: Every day | ORAL | Status: DC

## 2013-03-05 NOTE — Progress Notes (Signed)
HPI: Pleasant male for fu of coronary disease. He is status post coronary artery bypassing graft in [place] in 1995. Patient had a LIMA to the LAD and a saphenous vein graft to his marginal. He did have postoperative atrial fibrillation and a pulmonary embolus based on outside notes. Cardiac catheterization in 2007 showed patent grafts and normal LV function; normal renal arteries. Patient admitted to [facility] in September of 2013 with a CVA. Echocardiogram in September of 2013 showed an ejection fraction of 50-55%, trace aortic insufficiency. Transesophageal echocardiogram in September of 2013 showed normal LV function and trace aortic and mitral regurgitation. Carotid Dopplers in September of 2013 showed no significant extracranial carotid artery stenosis. Outpatient monitor showed sinus with occasional PVCs. Last myoview in Oct 2013 showed normal perfusion and EF 60. Since I last saw him, he denies dyspnea, exertional chest pain, syncope or edema.   Current Outpatient Prescriptions  Medication Sig Dispense Refill  . acetaminophen (TYLENOL) 650 MG CR tablet Take 1,300 mg by mouth daily.      . carvedilol (COREG) 6.25 MG tablet Take 1 tablet (6.25 mg total) by mouth 2 (two) times daily with a meal.  180 tablet  2  . cholecalciferol (VITAMIN D) 400 UNITS TABS Take 2,000 Units by mouth daily.       . clopidogrel (PLAVIX) 75 MG tablet Take 1 tablet (75 mg total) by mouth daily with breakfast.  90 tablet  2  . Cyanocobalamin (VITAMIN B-12 CR) 1500 MCG TBCR Take by mouth daily.      . finasteride (PROSCAR) 5 MG tablet Take 5 mg by mouth daily.      . isosorbide mononitrate (IMDUR) 30 MG 24 hr tablet Take 1 tablet (30 mg total) by mouth daily.  90 tablet  2  . Misc Natural Products (OSTEO BI-FLEX JOINT SHIELD PO) Take 2 tablets by mouth daily.       . Multiple Vitamin (MULTIVITAMIN) capsule Take 1 capsule by mouth daily.      . Omega-3 Fatty Acids (FP FISH OIL PO) Take 2,400 mg by  mouth daily.       . rivastigmine (EXELON) 9.5 mg/24hr Place 1 patch onto the skin daily.      . simvastatin (ZOCOR) 40 MG tablet Take 1 tablet (40 mg total) by mouth every evening.  90 tablet  2  . vitamin C (ASCORBIC ACID) 500 MG tablet Take 500 mg by mouth daily.       No current facility-administered medications for this visit.     Past Medical History  Diagnosis Date  . Hypertension   . Hyperlipidemia   . CAD (coronary artery disease)   . Pulmonary embolus     Time of CABG  . Dementia   . Depression   . Stroke     2013--Sept    Past Surgical History  Procedure Laterality Date  . Coronary artery bypass graft      [place]  . External ear surgery    . Tonsillectomy    . Tee without cardioversion  08/15/2012    Procedure: TRANSESOPHAGEAL ECHOCARDIOGRAM (TEE);  Surgeon: Pricilla Riffle, MD;  Location: [facility] ENDOSCOPY;  Service: Cardiovascular;  Laterality: N/A;    History   Social History  . Marital Status: Married    Spouse Name: N/A    Number of Children: 1  . Years of Education: N/A   Occupational History  . security     Retired   Social History Main Topics  .  Smoking status: Never Smoker   . Smokeless tobacco: Never Used  . Alcohol Use: No  . Drug Use: No  . Sexually Active: Not on file   Other Topics Concern  . Not on file   Social History Narrative  . No narrative on file    ROS: no fevers or chills, productive cough, hemoptysis, dysphasia, odynophagia, melena, hematochezia, dysuria, hematuria, rash, seizure activity, orthopnea, PND, pedal edema, claudication. Remaining systems are negative.  Physical Exam: Well-developed well-nourished in no acute distress.  Skin is warm and dry.  HEENT is normal.  Neck is supple.  Chest is clear to auscultation with normal expansion.  Cardiovascular exam is regular rate and rhythm.  Abdominal exam nontender or distended. No masses palpated. Extremities show no edema. neuro some dysarthria   ECG sinus  bradycardia at a rate of 56. First degree AV block.

## 2013-03-05 NOTE — Patient Instructions (Addendum)
Your physician wants you to follow-up in: 6 MONTHS WITH DR CRENSHAW IN [place] You will receive a reminder letter in the mail two months in advance. If you don't receive a letter, please call our office to schedule the follow-up appointment.  

## 2013-03-05 NOTE — Assessment & Plan Note (Signed)
Continue Plavix (patient placed on this medication at time of previous CVA). Continue statin. Recent nuclear study negative.

## 2013-03-05 NOTE — Assessment & Plan Note (Signed)
Continue statin. 

## 2013-03-05 NOTE — Assessment & Plan Note (Signed)
Continue present blood pressure medications. 

## 2013-03-11 ENCOUNTER — Ambulatory Visit (INDEPENDENT_AMBULATORY_CARE_PROVIDER_SITE_OTHER): Payer: Medicare Other | Admitting: Family Medicine

## 2013-03-11 ENCOUNTER — Encounter: Payer: Self-pay | Admitting: Family Medicine

## 2013-03-11 ENCOUNTER — Other Ambulatory Visit: Payer: Self-pay | Admitting: Family Medicine

## 2013-03-11 VITALS — BP 136/80 | HR 57 | Temp 98.1°F | Ht 70.0 in | Wt 177.1 lb

## 2013-03-11 DIAGNOSIS — I25810 Atherosclerosis of coronary artery bypass graft(s) without angina pectoris: Secondary | ICD-10-CM

## 2013-03-11 DIAGNOSIS — R739 Hyperglycemia, unspecified: Secondary | ICD-10-CM

## 2013-03-11 DIAGNOSIS — E785 Hyperlipidemia, unspecified: Secondary | ICD-10-CM

## 2013-03-11 DIAGNOSIS — I10 Essential (primary) hypertension: Secondary | ICD-10-CM

## 2013-03-11 DIAGNOSIS — D649 Anemia, unspecified: Secondary | ICD-10-CM

## 2013-03-11 DIAGNOSIS — F0390 Unspecified dementia without behavioral disturbance: Secondary | ICD-10-CM

## 2013-03-11 DIAGNOSIS — R7309 Other abnormal glucose: Secondary | ICD-10-CM

## 2013-03-11 LAB — CBC
HCT: 39.2 % (ref 39.0–52.0)
Platelets: 208 10*3/uL (ref 150–400)
RDW: 13.3 % (ref 11.5–15.5)
WBC: 4.7 10*3/uL (ref 4.0–10.5)

## 2013-03-11 LAB — TSH
TSH: 0.606 u[IU]/mL (ref 0.350–4.500)
TSH: 0.653 u[IU]/mL (ref 0.350–4.500)

## 2013-03-11 NOTE — Assessment & Plan Note (Signed)
Recent follow up with cardiology went well, no changes

## 2013-03-11 NOTE — Assessment & Plan Note (Signed)
Family reports he does better on the combination of Namenda and Exelon. When he stopped Namenda re suffered so he restarted.

## 2013-03-11 NOTE — Assessment & Plan Note (Signed)
Adequately controlled no change to meds but encouraged DASH diet.

## 2013-03-11 NOTE — Progress Notes (Signed)
Patient ID: Samuel Meyers, male   DOB: 08-18-1934, 79 y.o.   MRN: [id number] PACER DORN [id number] 1934/02/13 03/11/2013      Progress Note-Follow Up  Subjective  Chief Complaint  Chief Complaint  Patient presents with  . Follow-up    4 month    HPI  Patient is a 79 year old male who suffers with dementia and is here today with his family. He is patient is a 23 Caucasian male who is in today for followup. He is in today with his daughter and wife. He is doing well except for his worsening memory which continues to fail him. He tried stopping his Namenda temporarily and he had worsening memory problems so they restarted it. No recent illness, fevers, chills, chest pain or palpitations, shortness of breath, GI or GU concerns noted at today's visit. He is generally tolerating his medications  Past Medical History  Diagnosis Date  . Hypertension   . Hyperlipidemia   . CAD (coronary artery disease)   . Pulmonary embolus     Time of CABG  . Dementia   . Depression   . Stroke     2013--Sept    Past Surgical History  Procedure Laterality Date  . Coronary artery bypass graft      [place]  . External ear surgery    . Tonsillectomy    . Tee without cardioversion  08/15/2012    Procedure: TRANSESOPHAGEAL ECHOCARDIOGRAM (TEE);  Surgeon: Pricilla Riffle, MD;  Location: [facility] ENDOSCOPY;  Service: Cardiovascular;  Laterality: N/A;    Family History  Problem Relation Age of Onset  . Coronary artery disease Father     MI in his 74s    History   Social History  . Marital Status: Married    Spouse Name: N/A    Number of Children: 1  . Years of Education: N/A   Occupational History  . security     Retired   Social History Main Topics  . Smoking status: Never Smoker   . Smokeless tobacco: Never Used  . Alcohol Use: No  . Drug Use: No  . Sexually Active: Not on file   Other Topics Concern  . Not on file   Social History Narrative  . No narrative on file    Current  Outpatient Prescriptions on File Prior to Visit  Medication Sig Dispense Refill  . acetaminophen (TYLENOL) 650 MG CR tablet Take 1,300 mg by mouth daily.      . carvedilol (COREG) 6.25 MG tablet Take 1 tablet (6.25 mg total) by mouth 2 (two) times daily with a meal.  180 tablet  2  . cholecalciferol (VITAMIN D) 400 UNITS TABS Take 2,000 Units by mouth daily.       . clopidogrel (PLAVIX) 75 MG tablet Take 1 tablet (75 mg total) by mouth daily with breakfast.  90 tablet  2  . Cyanocobalamin (VITAMIN B-12 CR) 1500 MCG TBCR Take by mouth daily.      . finasteride (PROSCAR) 5 MG tablet Take 5 mg by mouth daily.      . isosorbide mononitrate (IMDUR) 30 MG 24 hr tablet Take 1 tablet (30 mg total) by mouth daily.  90 tablet  4  . Misc Natural Products (OSTEO BI-FLEX JOINT SHIELD PO) Take 2 tablets by mouth daily.       . Multiple Vitamin (MULTIVITAMIN) capsule Take 1 capsule by mouth daily.      . Omega-3 Fatty Acids (FP FISH OIL PO) Take 2,400  mg by mouth daily.       . rivastigmine (EXELON) 9.5 mg/24hr Place 1 patch onto the skin daily.      . simvastatin (ZOCOR) 40 MG tablet Take 1 tablet (40 mg total) by mouth every evening.  90 tablet  2  . vitamin C (ASCORBIC ACID) 500 MG tablet Take 500 mg by mouth daily.       No current facility-administered medications on file prior to visit.    Allergies  Allergen Reactions  . Niacin And Related Nausea And Vomiting  . Wellbutrin (Bupropion) Hives    Gi upset    Review of Systems  Review of Systems  Constitutional: Negative for fever and malaise/fatigue.  HENT: Negative for congestion.   Eyes: Negative for discharge.  Respiratory: Negative for shortness of breath.   Cardiovascular: Negative for chest pain, palpitations and leg swelling.  Gastrointestinal: Negative for nausea, abdominal pain and diarrhea.  Genitourinary: Negative for dysuria.  Musculoskeletal: Negative for falls.  Skin: Negative for rash.  Neurological: Positive for dizziness.  Negative for loss of consciousness and headaches.  Endo/Heme/Allergies: Negative for polydipsia.  Psychiatric/Behavioral: Positive for depression and memory loss. Negative for suicidal ideas. The patient is nervous/anxious. The patient does not have insomnia.     Objective  BP 136/80  Pulse 57  Temp(Src) 98.1 F (36.7 C) (Oral)  Ht 5\' 10"  (1.778 m)  Wt 177 lb 1.9 oz (80.341 kg)  BMI 25.41 kg/m2  SpO2 96%  Physical Exam  Physical Exam  Constitutional: He is oriented to person, place, and time and well-developed, well-nourished, and in no distress. No distress.  HENT:  Head: Normocephalic and atraumatic.  Eyes: Conjunctivae are normal.  Neck: Neck supple. No thyromegaly present.  Cardiovascular: Normal rate, regular rhythm and normal heart sounds.   No murmur heard. Pulmonary/Chest: Effort normal and breath sounds normal. No respiratory distress.  Abdominal: He exhibits no distension and no mass. There is no tenderness.  Musculoskeletal: He exhibits no edema.  Neurological: He is alert and oriented to person, place, and time.  Skin: Skin is warm.  Psychiatric: Memory, affect and judgment normal.    No results found for this basename: TSH   Lab Results  Component Value Date   WBC 4.7 03/11/2013   HGB 13.6 03/11/2013   HCT 39.2 03/11/2013   MCV 89.3 03/11/2013   PLT 208 03/11/2013   Lab Results  Component Value Date   CREATININE 0.90 11/08/2012   BUN 16 11/08/2012   NA 142 11/08/2012   K 4.8 11/08/2012   CL 104 11/08/2012   CO2 31 11/08/2012   Lab Results  Component Value Date   ALT 22 11/08/2012   AST 24 11/08/2012   ALKPHOS 38* 11/08/2012   BILITOT 0.7 11/08/2012   Lab Results  Component Value Date   CHOL 119 11/08/2012   Lab Results  Component Value Date   HDL 43 11/08/2012   Lab Results  Component Value Date   LDLCALC 59 11/08/2012   Lab Results  Component Value Date   TRIG 87 11/08/2012   Lab Results  Component Value Date   CHOLHDL 2.8 11/08/2012      Assessment & Plan  Hypertension Adequately controlled no change to meds but encouraged DASH diet.  Dementia Family reports he does better on the combination of Namenda and Exelon. When he stopped Namenda re suffered so he restarted.  CAD (coronary artery disease) of artery bypass graft Recent follow up with cardiology went well, no changes  Hyperlipidemia Tolerating Simvastatin, encouraged krill oil caps.

## 2013-03-11 NOTE — Patient Instructions (Addendum)
Digestive Advantage probiotics daily MegaRed krill oil caps daily by Schiff   Cholesterol Cholesterol is a white, waxy, fat-like protein needed by your body in small amounts. The liver makes all the cholesterol you need. It is carried from the liver by the blood through the blood vessels. Deposits (plaque) may build up on blood vessel walls. This makes the arteries narrower and stiffer. Plaque increases the risk for heart attack and stroke. You cannot feel your cholesterol level even if it is very high. The only way to know is by a blood test to check your lipid (fats) levels. Once you know your cholesterol levels, you should keep a record of the test results. Work with your caregiver to to keep your levels in the desired range. WHAT THE RESULTS MEAN:  Total cholesterol is a rough measure of all the cholesterol in your blood.  LDL is the so-called bad cholesterol. This is the type that deposits cholesterol in the walls of the arteries. You want this level to be low.  HDL is the good cholesterol because it cleans the arteries and carries the LDL away. You want this level to be high.  Triglycerides are fat that the body can either burn for energy or store. High levels are closely linked to heart disease. DESIRED LEVELS:  Total cholesterol below 200.  LDL below 100 for people at risk, below 70 for very high risk.  HDL above 50 is good, above 60 is best.  Triglycerides below 150. HOW TO LOWER YOUR CHOLESTEROL:  Diet.  Choose fish or white meat chicken and [place], roasted or baked. Limit fatty cuts of red meat, fried foods, and processed meats, such as sausage and lunch meat.  Eat lots of fresh fruits and vegetables. Choose whole grains, beans, pasta, potatoes and cereals.  Use only small amounts of olive, corn or canola oils. Avoid butter, mayonnaise, shortening or palm kernel oils. Avoid foods with trans-fats.  Use skim/nonfat milk and low-fat/nonfat yogurt and cheeses. Avoid whole  milk, cream, ice cream, egg yolks and cheeses. Healthy desserts include angel food cake, ginger snaps, animal crackers, hard candy, popsicles, and low-fat/nonfat frozen yogurt. Avoid pastries, cakes, pies and cookies.  Exercise.  A regular program helps decrease LDL and raises HDL.  Helps with weight control.  Do things that increase your activity level like gardening, walking, or taking the stairs.  Medication.  May be prescribed by your caregiver to help lowering cholesterol and the risk for heart disease.  You may need medicine even if your levels are normal if you have several risk factors. HOME CARE INSTRUCTIONS   Follow your diet and exercise programs as suggested by your caregiver.  Take medications as directed.  Have blood work done when your caregiver feels it is necessary. MAKE SURE YOU:   Understand these instructions.  Will watch your condition.  Will get help right away if you are not doing well or get worse. Document Released: 08/15/2001 Document Revised: 02/12/2012 Document Reviewed: 02/05/2008 [facility] Patient Information 2013 [place], [place].

## 2013-03-11 NOTE — Assessment & Plan Note (Signed)
Tolerating Simvastatin, encouraged krill oil caps.

## 2013-03-12 LAB — BASIC METABOLIC PANEL
BUN: 11 mg/dL (ref 6–23)
CO2: 29 mEq/L (ref 19–32)
Calcium: 10 mg/dL (ref 8.4–10.5)
Creat: 0.87 mg/dL (ref 0.50–1.35)
Glucose, Bld: 93 mg/dL (ref 70–99)

## 2013-03-12 LAB — CBC
HCT: 39 % (ref 39.0–52.0)
Hemoglobin: 13.5 g/dL (ref 13.0–17.0)
MCH: 30.5 pg (ref 26.0–34.0)
MCV: 88.2 fL (ref 78.0–100.0)
RBC: 4.42 MIL/uL (ref 4.22–5.81)

## 2013-03-12 LAB — LIPID PANEL
Cholesterol: 106 mg/dL (ref 0–200)
HDL: 44 mg/dL (ref >39)
LDL Cholesterol: 45 mg/dL (ref 0–99)
Triglycerides: 86 mg/dL (ref <150)

## 2013-03-12 LAB — HEPATIC FUNCTION PANEL
Albumin: 4.7 g/dL (ref 3.5–5.2)
Alkaline Phosphatase: 40 U/L (ref 39–117)
Total Bilirubin: 0.8 mg/dL (ref 0.3–1.2)

## 2013-05-06 ENCOUNTER — Other Ambulatory Visit: Payer: Self-pay | Admitting: General Practice

## 2013-05-06 MED ORDER — CLOPIDOGREL BISULFATE 75 MG PO TABS: 75.0000 mg | ORAL_TABLET | Freq: Every day | ORAL | Status: DC

## 2013-05-06 NOTE — Telephone Encounter (Signed)
Med filled.  

## 2013-06-02 ENCOUNTER — Other Ambulatory Visit: Payer: Self-pay

## 2013-06-02 MED ORDER — CARVEDILOL 6.25 MG PO TABS: 6.2500 mg | ORAL_TABLET | Freq: Two times a day (BID) | ORAL | Status: DC

## 2013-06-02 MED ORDER — SIMVASTATIN 40 MG PO TABS: 40.0000 mg | ORAL_TABLET | Freq: Every evening | ORAL | Status: DC

## 2013-10-13 ENCOUNTER — Telehealth: Payer: Self-pay | Admitting: *Deleted

## 2013-10-13 DIAGNOSIS — D62 Acute posthemorrhagic anemia: Secondary | ICD-10-CM

## 2013-10-13 DIAGNOSIS — R739 Hyperglycemia, unspecified: Secondary | ICD-10-CM

## 2013-10-13 DIAGNOSIS — I10 Essential (primary) hypertension: Secondary | ICD-10-CM

## 2013-10-13 DIAGNOSIS — D649 Anemia, unspecified: Secondary | ICD-10-CM

## 2013-10-13 DIAGNOSIS — Z79899 Other long term (current) drug therapy: Secondary | ICD-10-CM

## 2013-10-13 DIAGNOSIS — E785 Hyperlipidemia, unspecified: Secondary | ICD-10-CM

## 2013-10-13 LAB — CBC
HCT: 40.3 % (ref 39.0–52.0)
Hemoglobin: 14.1 g/dL (ref 13.0–17.0)
MCHC: 35 g/dL (ref 30.0–36.0)
RDW: 13.3 % (ref 11.5–15.5)
WBC: 6.8 10*3/uL (ref 4.0–10.5)

## 2013-10-13 LAB — BASIC METABOLIC PANEL
BUN: 9 mg/dL (ref 6–23)
CO2: 29 mEq/L (ref 19–32)
Calcium: 8.9 mg/dL (ref 8.4–10.5)
Chloride: 102 mEq/L (ref 96–112)
Creat: 0.88 mg/dL (ref 0.50–1.35)
Glucose, Bld: 92 mg/dL (ref 70–99)

## 2013-10-13 LAB — LIPID PANEL
Cholesterol: 111 mg/dL (ref 0–200)
HDL: 48 mg/dL (ref >39)
Total CHOL/HDL Ratio: 2.3 Ratio
VLDL: 14 mg/dL (ref 0–40)

## 2013-10-13 LAB — HEPATIC FUNCTION PANEL
AST: 24 U/L (ref 0–37)
Albumin: 4.4 g/dL (ref 3.5–5.2)
Bilirubin, Direct: 0.2 mg/dL (ref 0.0–0.3)
Total Bilirubin: 0.8 mg/dL (ref 0.3–1.2)

## 2013-10-13 NOTE — Telephone Encounter (Signed)
Lab orders placed, forwarded to [facility] 

## 2013-10-14 LAB — TSH: TSH: 1.943 u[IU]/mL (ref 0.350–4.500)

## 2013-10-20 ENCOUNTER — Ambulatory Visit (INDEPENDENT_AMBULATORY_CARE_PROVIDER_SITE_OTHER): Payer: Medicare Other | Admitting: Family Medicine

## 2013-10-20 ENCOUNTER — Encounter: Payer: Self-pay | Admitting: Family Medicine

## 2013-10-20 VITALS — BP 148/78 | HR 56 | Temp 98.2°F | Ht 70.0 in | Wt 180.0 lb

## 2013-10-20 DIAGNOSIS — K529 Noninfective gastroenteritis and colitis, unspecified: Secondary | ICD-10-CM

## 2013-10-20 DIAGNOSIS — I25810 Atherosclerosis of coronary artery bypass graft(s) without angina pectoris: Secondary | ICD-10-CM

## 2013-10-20 DIAGNOSIS — E785 Hyperlipidemia, unspecified: Secondary | ICD-10-CM

## 2013-10-20 DIAGNOSIS — K5289 Other specified noninfective gastroenteritis and colitis: Secondary | ICD-10-CM

## 2013-10-20 DIAGNOSIS — Z23 Encounter for immunization: Secondary | ICD-10-CM

## 2013-10-20 DIAGNOSIS — I10 Essential (primary) hypertension: Secondary | ICD-10-CM

## 2013-10-20 DIAGNOSIS — F0390 Unspecified dementia without behavioral disturbance: Secondary | ICD-10-CM

## 2013-10-20 HISTORY — DX: Noninfective gastroenteritis and colitis, unspecified: K52.9

## 2013-10-20 MED ORDER — MEMANTINE HCL 10 MG PO TABS: 10.0000 mg | ORAL_TABLET | Freq: Two times a day (BID) | ORAL | Status: DC

## 2013-10-20 MED ORDER — LISINOPRIL 5 MG PO TABS: 5.0000 mg | ORAL_TABLET | Freq: Every day | ORAL | Status: DC

## 2013-10-20 MED ORDER — PNEUMOCOCCAL 13-VAL CONJ VACC IM SUSP: 0.5000 mL | Freq: Once | INTRAMUSCULAR | Status: DC

## 2013-10-20 NOTE — Patient Instructions (Signed)
Ginger can help with nausea  Viral Gastroenteritis Viral gastroenteritis is also known as stomach flu. This condition affects the stomach and intestinal tract. It can cause sudden diarrhea and vomiting. The illness typically lasts 3 to 8 days. Most people develop an immune response that eventually gets rid of the virus. While this natural response develops, the virus can make you quite ill. CAUSES  Many different viruses can cause gastroenteritis, such as rotavirus or noroviruses. You can catch one of these viruses by consuming contaminated food or water. You may also catch a virus by sharing utensils or other personal items with an infected person or by touching a contaminated surface. SYMPTOMS  The most common symptoms are diarrhea and vomiting. These problems can cause a severe loss of body fluids (dehydration) and a body salt (electrolyte) imbalance. Other symptoms may include:  Fever.  Headache.  Fatigue.  Abdominal pain. DIAGNOSIS  Your caregiver can usually diagnose viral gastroenteritis based on your symptoms and a physical exam. A stool sample may also be taken to test for the presence of viruses or other infections. TREATMENT  This illness typically goes away on its own. Treatments are aimed at rehydration. The most serious cases of viral gastroenteritis involve vomiting so severely that you are not able to keep fluids down. In these cases, fluids must be given through an intravenous line (IV). HOME CARE INSTRUCTIONS   Drink enough fluids to keep your urine clear or pale yellow. Drink small amounts of fluids frequently and increase the amounts as tolerated.  Ask your caregiver for specific rehydration instructions.  Avoid:  Foods high in sugar.  Alcohol.  Carbonated drinks.  Tobacco.  Juice.  Caffeine drinks.  Extremely hot or cold fluids.  Fatty, greasy foods.  Too much intake of anything at one time.  Dairy products until 24 to 48 hours after diarrhea  stops.  You may consume probiotics. Probiotics are active cultures of beneficial bacteria. They may lessen the amount and number of diarrheal stools in adults. Probiotics can be found in yogurt with active cultures and in supplements.  Wash your hands well to avoid spreading the virus.  Only take over-the-counter or prescription medicines for pain, discomfort, or fever as directed by your caregiver. Do not give aspirin to children. Antidiarrheal medicines are not recommended.  Ask your caregiver if you should continue to take your regular prescribed and over-the-counter medicines.  Keep all follow-up appointments as directed by your caregiver. SEEK IMMEDIATE MEDICAL CARE IF:   You are unable to keep fluids down.  You do not urinate at least once every 6 to 8 hours.  You develop shortness of breath.  You notice blood in your stool or vomit. This may look like coffee grounds.  You have abdominal pain that increases or is concentrated in one small area (localized).  You have persistent vomiting or diarrhea.  You have a fever.  The patient is a child younger than 3 months, and he or she has a fever.  The patient is a child older than 3 months, and he or she has a fever and persistent symptoms.  The patient is a child older than 3 months, and he or she has a fever and symptoms suddenly get worse.  The patient is a baby, and he or she has no tears when crying. MAKE SURE YOU:   Understand these instructions.  Will watch your condition.  Will get help right away if you are not doing well or get worse. Document Released: 11/20/2005  Document Revised: 02/12/2012 Document Reviewed: 09/06/2011 [facility] Patient Information 2014 [place], [place].

## 2013-10-20 NOTE — Progress Notes (Signed)
Patient ID: Samuel Meyers, male   DOB: 1934/05/29, 79 y.o.   MRN: [id number] ATSUSHI YOM [id number] [birth year] 10/20/2013      Progress Note-Follow Up  Subjective  Chief Complaint  Chief Complaint  Patient presents with  . Follow-up    6 month    HPI  Patient is a 79 year old Caucasian male who is in today for six-month followup. Overall feeling well. Did have an episode of what he describes as nausea and an adverse reaction a very bad smell and in some vomiting several days ago but this has not recurred. Today he denies any fevers, chest pain, palpitations, nausea vomiting. He denies constipation, shortness of breath, GU complaints at this time. Is taking medications as prescribed. They do note his dementia continues to worsen but it is still manageable in the year doing well at home.  Past Medical History  Diagnosis Date  . Hypertension   . Hyperlipidemia   . CAD (coronary artery disease)   . Pulmonary embolus     Time of CABG  . Dementia   . Depression   . Stroke     2013--Sept    Past Surgical History  Procedure Laterality Date  . Coronary artery bypass graft      [place]  . External ear surgery    . Tonsillectomy    . Tee without cardioversion  08/15/2012    Procedure: TRANSESOPHAGEAL ECHOCARDIOGRAM (TEE);  Surgeon: Pricilla Riffle, MD;  Location: [facility] ENDOSCOPY;  Service: Cardiovascular;  Laterality: N/A;    Family History  Problem Relation Age of Onset  . Coronary artery disease Father     MI in his 57s    History   Social History  . Marital Status: Married    Spouse Name: N/A    Number of Children: 1  . Years of Education: N/A   Occupational History  . security     Retired   Social History Main Topics  . Smoking status: Never Smoker   . Smokeless tobacco: Never Used  . Alcohol Use: No  . Drug Use: No  . Sexual Activity: Not on file   Other Topics Concern  . Not on file   Social History Narrative  . No narrative on file    Current  Outpatient Prescriptions on File Prior to Visit  Medication Sig Dispense Refill  . acetaminophen (TYLENOL) 650 MG CR tablet Take 1,300 mg by mouth daily.      . carvedilol (COREG) 6.25 MG tablet Take 1 tablet (6.25 mg total) by mouth 2 (two) times daily with a meal.  180 tablet  1  . cholecalciferol (VITAMIN D) 400 UNITS TABS Take 2,000 Units by mouth daily.       . clopidogrel (PLAVIX) 75 MG tablet Take 1 tablet (75 mg total) by mouth daily with breakfast.  90 tablet  2  . Cyanocobalamin (VITAMIN B-12 CR) 1500 MCG TBCR Take by mouth daily.      . finasteride (PROSCAR) 5 MG tablet Take 5 mg by mouth daily.      . isosorbide mononitrate (IMDUR) 30 MG 24 hr tablet Take 1 tablet (30 mg total) by mouth daily.  90 tablet  4  . memantine (NAMENDA) 10 MG tablet Take 1 tablet (10 mg total) by mouth 2 (two) times daily.      . Misc Natural Products (OSTEO BI-FLEX JOINT SHIELD PO) Take 2 tablets by mouth daily.       . Multiple Vitamin (MULTIVITAMIN) capsule Take  1 capsule by mouth daily.      . Omega-3 Fatty Acids (FP FISH OIL PO) Take 2,400 mg by mouth daily.       . rivastigmine (EXELON) 9.5 mg/24hr Place 1 patch onto the skin daily.      . simvastatin (ZOCOR) 40 MG tablet Take 1 tablet (40 mg total) by mouth every evening.  90 tablet  1  . vitamin C (ASCORBIC ACID) 500 MG tablet Take 500 mg by mouth daily.       No current facility-administered medications on file prior to visit.    Allergies  Allergen Reactions  . Niacin And Related Nausea And Vomiting  . Wellbutrin [Bupropion] Hives    Gi upset    Review of Systems  Review of Systems  Constitutional: Positive for malaise/fatigue. Negative for fever.  HENT: Negative for congestion.   Eyes: Negative for discharge.  Respiratory: Negative for shortness of breath.   Cardiovascular: Negative for chest pain, palpitations and leg swelling.  Gastrointestinal: Positive for nausea and vomiting. Negative for abdominal pain and diarrhea.   Genitourinary: Negative for dysuria.  Musculoskeletal: Negative for falls.  Skin: Negative for rash.  Neurological: Negative for loss of consciousness and headaches.  Endo/Heme/Allergies: Negative for polydipsia.  Psychiatric/Behavioral: Negative for depression and suicidal ideas. The patient is not nervous/anxious and does not have insomnia.     Objective  BP 166/80  Pulse 56  Temp(Src) 98.2 F (36.8 C) (Oral)  Ht 5\' 10"  (1.778 m)  Wt 180 lb (81.647 kg)  BMI 25.83 kg/m2  SpO2 99%  Physical Exam  Physical Exam  Constitutional: He is oriented to person, place, and time and well-developed, well-nourished, and in no distress. No distress.  HENT:  Head: Normocephalic and atraumatic.  Eyes: Conjunctivae are normal.  Neck: Neck supple. No thyromegaly present.  Cardiovascular: Normal rate, regular rhythm and normal heart sounds.   No murmur heard. Pulmonary/Chest: Effort normal and breath sounds normal. No respiratory distress.  Abdominal: He exhibits no distension and no mass. There is no tenderness.  Musculoskeletal: He exhibits no edema.  Neurological: He is alert and oriented to person, place, and time.  Skin: Skin is warm.  Psychiatric: Memory, affect and judgment normal.    Lab Results  Component Value Date   TSH 1.943 10/13/2013   Lab Results  Component Value Date   WBC 6.8 10/13/2013   HGB 14.1 10/13/2013   HCT 40.3 10/13/2013   MCV 91.6 10/13/2013   PLT 235 10/13/2013   Lab Results  Component Value Date   CREATININE 0.88 10/13/2013   BUN 9 10/13/2013   NA 139 10/13/2013   K 4.8 10/13/2013   CL 102 10/13/2013   CO2 29 10/13/2013   Lab Results  Component Value Date   ALT 22 10/13/2013   AST 24 10/13/2013   ALKPHOS 49 10/13/2013   BILITOT 0.8 10/13/2013   Lab Results  Component Value Date   CHOL 111 10/13/2013   Lab Results  Component Value Date   HDL 48 10/13/2013   Lab Results  Component Value Date   LDLCALC 49 10/13/2013   Lab Results   Component Value Date   TRIG 68 10/13/2013   Lab Results  Component Value Date   CHOLHDL 2.3 10/13/2013     Assessment & Plan  Gastroenteritis Episode of feeling badly, smelled something foul that no one else smelled then he started vomiting.  Had vomiting now resolved. Try ginger for nausea.  Hyperlipidemia Well controlled, no changes, tolerating  Simvastatin  Hypertension Improved some on recheck, asymptomatic, continue current meds for now and encouraged DASH diet  CAD (coronary artery disease) of artery bypass graft Symptomatic, doing very well  Dementia Progressing but family and patient still able to manage on current meds will continue

## 2013-10-20 NOTE — Assessment & Plan Note (Signed)
Episode of feeling badly, smelled something foul that no one else smelled then he started vomiting.  Had vomiting now resolved. Try ginger for nausea.

## 2013-10-20 NOTE — Progress Notes (Signed)
Pre visit review using our clinic review tool, if applicable. No additional management support is needed unless otherwise documented below in the visit note. 

## 2013-10-20 NOTE — Assessment & Plan Note (Signed)
Well controlled, no changes, tolerating Simvastatin

## 2013-10-23 ENCOUNTER — Other Ambulatory Visit: Payer: Self-pay | Admitting: Family Medicine

## 2013-10-25 NOTE — Assessment & Plan Note (Signed)
Improved some on recheck, asymptomatic, continue current meds for now and encouraged DASH diet

## 2013-10-25 NOTE — Assessment & Plan Note (Signed)
Progressing but family and patient still able to manage on current meds will continue

## 2013-10-25 NOTE — Assessment & Plan Note (Signed)
Symptomatic, doing very well

## 2013-11-19 ENCOUNTER — Encounter: Payer: Self-pay | Admitting: Cardiology

## 2013-11-19 ENCOUNTER — Ambulatory Visit (INDEPENDENT_AMBULATORY_CARE_PROVIDER_SITE_OTHER): Payer: Medicare Other | Admitting: Cardiology

## 2013-11-19 ENCOUNTER — Telehealth: Payer: Self-pay | Admitting: *Deleted

## 2013-11-19 VITALS — BP 142/80 | HR 56 | Wt 182.0 lb

## 2013-11-19 DIAGNOSIS — I25810 Atherosclerosis of coronary artery bypass graft(s) without angina pectoris: Secondary | ICD-10-CM

## 2013-11-19 DIAGNOSIS — I10 Essential (primary) hypertension: Secondary | ICD-10-CM

## 2013-11-19 DIAGNOSIS — E785 Hyperlipidemia, unspecified: Secondary | ICD-10-CM

## 2013-11-19 NOTE — Patient Instructions (Signed)
Your physician wants you to follow-up in: ONE YEAR WITH DR CRENSHAW You will receive a reminder letter in the mail two months in advance. If you don't receive a letter, please call our office to schedule the follow-up appointment.  

## 2013-11-19 NOTE — Telephone Encounter (Signed)
Patient presented for labs this morning [with wife], need to know what lab[s] patient needed & diagnosis code[s] as this was not stated in AVS at 11.17.14 OV, where lab results were also discussed/SLS Please Advise.

## 2013-11-19 NOTE — Assessment & Plan Note (Signed)
Blood pressure controlled. Continue present medications. 

## 2013-11-19 NOTE — Telephone Encounter (Signed)
Unable to see any labs he would benefit from am not sure why they stopped except that they were seeing cardiology today. Too soon to repeat labs.

## 2013-11-19 NOTE — Progress Notes (Signed)
HPI: Pleasant male for fu of coronary disease. He is status post coronary artery bypassing graft in [place] in 1995. Patient had a LIMA to the LAD and a saphenous vein graft to his marginal. He did have postoperative atrial fibrillation and a pulmonary embolus based on outside notes. Cardiac catheterization in 2007 showed patent grafts and normal LV function; normal renal arteries. Patient admitted to [facility] in September of 2013 with a CVA. Echocardiogram in September of 2013 showed an ejection fraction of 50-55%, trace aortic insufficiency. Transesophageal echocardiogram in September of 2013 showed normal LV function and trace aortic and mitral regurgitation. Carotid Dopplers in September of 2013 showed no significant extracranial carotid artery stenosis. Outpatient monitor showed sinus with occasional PVCs. Last myoview in Oct 2013 showed normal perfusion and EF 60. Since I last saw him in April 2014, he denies dyspnea, exertional chest pain, syncope or edema.   Current Outpatient Prescriptions  Medication Sig Dispense Refill  . acetaminophen (TYLENOL) 650 MG CR tablet Take 1,300 mg by mouth daily.      . carvedilol (COREG) 6.25 MG tablet Take 1 tablet (6.25 mg total) by mouth 2 (two) times daily with a meal.  180 tablet  1  . cholecalciferol (VITAMIN D) 400 UNITS TABS Take 2,000 Units by mouth daily.       . clopidogrel (PLAVIX) 75 MG tablet Take 1 tablet (75 mg total) by mouth daily with breakfast.  90 tablet  2  . Cyanocobalamin (VITAMIN B-12 CR) 1500 MCG TBCR Take by mouth daily.      . finasteride (PROSCAR) 5 MG tablet Take 5 mg by mouth daily.      . isosorbide mononitrate (IMDUR) 30 MG 24 hr tablet Take 1 tablet (30 mg total) by mouth daily.  90 tablet  4  . lisinopril (PRINIVIL,ZESTRIL) 5 MG tablet Take 1 tablet (5 mg total) by mouth daily.  90 tablet  0  . memantine (NAMENDA) 10 MG tablet Take 1 tablet (10 mg total) by mouth 2 (two) times daily.  180 tablet  1    . Misc Natural Products (OSTEO BI-FLEX JOINT SHIELD PO) Take 2 tablets by mouth daily.       . Multiple Vitamin (MULTIVITAMIN) capsule Take 1 capsule by mouth daily.      . Omega-3 Fatty Acids (FP FISH OIL PO) Take 2,400 mg by mouth daily.       . rivastigmine (EXELON) 9.5 mg/24hr Place 1 patch onto the skin daily.      . simvastatin (ZOCOR) 40 MG tablet TAKE 1 TABLET EVERY EVENING  90 tablet  1  . vitamin C (ASCORBIC ACID) 500 MG tablet Take 500 mg by mouth daily.       No current facility-administered medications for this visit.     Past Medical History  Diagnosis Date  . Hypertension   . Hyperlipidemia   . CAD (coronary artery disease)   . Pulmonary embolus     Time of CABG  . Dementia   . Depression   . Stroke     2013--Sept  . Gastroenteritis 10/20/2013    Past Surgical History  Procedure Laterality Date  . Coronary artery bypass graft      [place]  . External ear surgery    . Tonsillectomy    . Tee without cardioversion  08/15/2012    Procedure: TRANSESOPHAGEAL ECHOCARDIOGRAM (TEE);  Surgeon: Pricilla Riffle, MD;  Location: [facility] ENDOSCOPY;  Service: Cardiovascular;  Laterality:  N/A;    History   Social History  . Marital Status: Married    Spouse Name: N/A    Number of Children: 1  . Years of Education: N/A   Occupational History  . security     Retired   Social History Main Topics  . Smoking status: Never Smoker   . Smokeless tobacco: Never Used  . Alcohol Use: No  . Drug Use: No  . Sexual Activity: Not on file   Other Topics Concern  . Not on file   Social History Narrative  . No narrative on file    ROS: no fevers or chills, productive cough, hemoptysis, dysphasia, odynophagia, melena, hematochezia, dysuria, hematuria, rash, seizure activity, orthopnea, PND, pedal edema, claudication. Remaining systems are negative.  Physical Exam: Well-developed well-nourished in no acute distress.  Skin is warm and dry.  HEENT is normal.  Neck is  supple.  Chest is clear to auscultation with normal expansion. Previous sternotomy Cardiovascular exam is regular rate and rhythm.  Abdominal exam nontender or distended. No masses palpated. Extremities show no edema. neuro expressive aphasia  ECG sinus rhythm at a rate of 56. First degree AV block.

## 2013-11-19 NOTE — Assessment & Plan Note (Signed)
Continue statin. Lipids and liver monitored by primary care. 

## 2013-11-19 NOTE — Assessment & Plan Note (Signed)
-   Continue Plavix and statin 

## 2013-12-17 ENCOUNTER — Other Ambulatory Visit: Payer: Self-pay | Admitting: Family Medicine

## 2013-12-29 ENCOUNTER — Other Ambulatory Visit: Payer: Self-pay | Admitting: Family Medicine

## 2013-12-29 NOTE — Telephone Encounter (Signed)
Rx request to pharmacy/SLS  

## 2013-12-30 ENCOUNTER — Other Ambulatory Visit: Payer: Self-pay | Admitting: Family Medicine

## 2014-01-26 ENCOUNTER — Ambulatory Visit: Payer: Medicare Other | Admitting: Family Medicine

## 2014-02-26 ENCOUNTER — Ambulatory Visit (INDEPENDENT_AMBULATORY_CARE_PROVIDER_SITE_OTHER): Payer: Medicare Other | Admitting: Family Medicine

## 2014-02-26 ENCOUNTER — Telehealth: Payer: Self-pay | Admitting: Family Medicine

## 2014-02-26 ENCOUNTER — Encounter: Payer: Self-pay | Admitting: Family Medicine

## 2014-02-26 VITALS — BP 150/82 | HR 81 | Temp 98.1°F | Ht 70.0 in | Wt 180.1 lb

## 2014-02-26 DIAGNOSIS — I25810 Atherosclerosis of coronary artery bypass graft(s) without angina pectoris: Secondary | ICD-10-CM

## 2014-02-26 DIAGNOSIS — D649 Anemia, unspecified: Secondary | ICD-10-CM

## 2014-02-26 DIAGNOSIS — E785 Hyperlipidemia, unspecified: Secondary | ICD-10-CM

## 2014-02-26 DIAGNOSIS — I10 Essential (primary) hypertension: Secondary | ICD-10-CM

## 2014-02-26 DIAGNOSIS — R7309 Other abnormal glucose: Secondary | ICD-10-CM

## 2014-02-26 DIAGNOSIS — R739 Hyperglycemia, unspecified: Secondary | ICD-10-CM

## 2014-02-26 LAB — LIPID PANEL
Cholesterol: 95 mg/dL (ref 0–200)
HDL: 42 mg/dL (ref >39)
LDL Cholesterol: 40 mg/dL (ref 0–99)
TRIGLYCERIDES: 63 mg/dL (ref <150)
Total CHOL/HDL Ratio: 2.3 Ratio
VLDL: 13 mg/dL (ref 0–40)

## 2014-02-26 LAB — RENAL FUNCTION PANEL
Albumin: 4.4 g/dL (ref 3.5–5.2)
BUN: 12 mg/dL (ref 6–23)
CALCIUM: 9.4 mg/dL (ref 8.4–10.5)
CO2: 30 mEq/L (ref 19–32)
Chloride: 105 mEq/L (ref 96–112)
Creat: 0.82 mg/dL (ref 0.50–1.35)
Glucose, Bld: 85 mg/dL (ref 70–99)
Phosphorus: 3.9 mg/dL (ref 2.3–4.6)
Potassium: 4.7 mEq/L (ref 3.5–5.3)
SODIUM: 142 meq/L (ref 135–145)

## 2014-02-26 LAB — HEPATIC FUNCTION PANEL
ALBUMIN: 4.4 g/dL (ref 3.5–5.2)
ALT: 20 U/L (ref 0–53)
AST: 31 U/L (ref 0–37)
Alkaline Phosphatase: 46 U/L (ref 39–117)
Bilirubin, Direct: 0.2 mg/dL (ref 0.0–0.3)
Indirect Bilirubin: 0.6 mg/dL (ref 0.2–1.2)
Total Bilirubin: 0.8 mg/dL (ref 0.2–1.2)
Total Protein: 6.9 g/dL (ref 6.0–8.3)

## 2014-02-26 LAB — CBC
HCT: 37.2 % — ABNORMAL LOW (ref 39.0–52.0)
Hemoglobin: 12.8 g/dL — ABNORMAL LOW (ref 13.0–17.0)
MCH: 30.6 pg (ref 26.0–34.0)
MCHC: 34.4 g/dL (ref 30.0–36.0)
MCV: 89 fL (ref 78.0–100.0)
PLATELETS: 226 10*3/uL (ref 150–400)
RBC: 4.18 MIL/uL — ABNORMAL LOW (ref 4.22–5.81)
RDW: 13.1 % (ref 11.5–15.5)
WBC: 5.7 10*3/uL (ref 4.0–10.5)

## 2014-02-26 LAB — HEMOGLOBIN A1C
HEMOGLOBIN A1C: 5.9 % — AB (ref <5.7)
MEAN PLASMA GLUCOSE: 123 mg/dL — AB (ref <117)

## 2014-02-26 MED ORDER — LISINOPRIL 10 MG PO TABS: 10.0000 mg | ORAL_TABLET | Freq: Every day | ORAL | Status: DC

## 2014-02-26 NOTE — Telephone Encounter (Signed)
emmi mailed to patient °

## 2014-02-26 NOTE — Patient Instructions (Signed)

## 2014-02-26 NOTE — Progress Notes (Signed)
Pre visit review using our clinic review tool, if applicable. No additional management support is needed unless otherwise documented below in the visit note. 

## 2014-02-27 LAB — TSH: TSH: 0.84 u[IU]/mL (ref 0.350–4.500)

## 2014-03-01 ENCOUNTER — Encounter: Payer: Self-pay | Admitting: Family Medicine

## 2014-03-01 DIAGNOSIS — R739 Hyperglycemia, unspecified: Secondary | ICD-10-CM | POA: Insufficient documentation

## 2014-03-01 DIAGNOSIS — D649 Anemia, unspecified: Secondary | ICD-10-CM | POA: Insufficient documentation

## 2014-03-01 HISTORY — DX: Anemia, unspecified: D64.9

## 2014-03-01 NOTE — Assessment & Plan Note (Signed)
Poorly controlled will alter medications, encouraged DASH diet, minimize caffeine and obtain adequate sleep. Report concerning symptoms and follow up as directed and as needed. Increase Lisinopril 10 mg daily

## 2014-03-01 NOTE — Assessment & Plan Note (Signed)
hgba1c acceptable, minimize simple carbs. Increase exercise as tolerated.  

## 2014-03-01 NOTE — Assessment & Plan Note (Signed)
asymptomatic

## 2014-03-01 NOTE — Progress Notes (Signed)
Patient ID: Samuel Meyers, male   DOB: 1934/02/16, 80 y.o.   MRN: [id number] Samuel Meyers [id number] 1934/04/18 03/01/2014      Progress Note-Follow Up  Subjective  Chief Complaint  Chief Complaint  Patient presents with  . Follow-up    3 month    HPI  Patient is a 80 year old male in today for routine medical care. He is here today with his wife. He is generally doing well. He denies any recent illness. He has no acute concerns today. Denies CP/palp/SOB/HA/congestion/fevers/GI or GU c/o. Taking meds as prescribed  Past Medical History  Diagnosis Date  . Hypertension   . Hyperlipidemia   . CAD (coronary artery disease)   . Pulmonary embolus     Time of CABG  . Dementia   . Depression   . Stroke     2013--Sept  . Gastroenteritis 10/20/2013  . Anemia 03/01/2014    Past Surgical History  Procedure Laterality Date  . Coronary artery bypass graft      [place]  . External ear surgery    . Tonsillectomy    . Tee without cardioversion  08/15/2012    Procedure: TRANSESOPHAGEAL ECHOCARDIOGRAM (TEE);  Surgeon: Fay Records, MD;  Location: [facility] ENDOSCOPY;  Service: Cardiovascular;  Laterality: N/A;    Family History  Problem Relation Age of Onset  . Coronary artery disease Father     MI in his 39s    History   Social History  . Marital Status: Married    Spouse Name: N/A    Number of Children: 1  . Years of Education: N/A   Occupational History  . security     Retired   Social History Main Topics  . Smoking status: Never Smoker   . Smokeless tobacco: Never Used  . Alcohol Use: No  . Drug Use: No  . Sexual Activity: Not on file   Other Topics Concern  . Not on file   Social History Narrative  . No narrative on file    Current Outpatient Prescriptions on File Prior to Visit  Medication Sig Dispense Refill  . acetaminophen (TYLENOL) 650 MG CR tablet Take 1,300 mg by mouth daily.      . cholecalciferol (VITAMIN D) 400 UNITS TABS Take 2,000 Units by  mouth daily.       . clopidogrel (PLAVIX) 75 MG tablet TAKE 1 TABLET DAILY WITH BREAKFAST  90 tablet  0  . Cyanocobalamin (VITAMIN B-12 CR) 1500 MCG TBCR Take by mouth daily.      . finasteride (PROSCAR) 5 MG tablet Take 5 mg by mouth daily.      . isosorbide mononitrate (IMDUR) 30 MG 24 hr tablet Take 1 tablet (30 mg total) by mouth daily.  90 tablet  4  . memantine (NAMENDA) 10 MG tablet Take 1 tablet (10 mg total) by mouth 2 (two) times daily.  180 tablet  1  . Misc Natural Products (OSTEO BI-FLEX JOINT SHIELD PO) Take 2 tablets by mouth daily.       . Multiple Vitamin (MULTIVITAMIN) capsule Take 1 capsule by mouth daily.      . Omega-3 Fatty Acids (FP FISH OIL PO) Take 2,400 mg by mouth daily.       . rivastigmine (EXELON) 9.5 mg/24hr Place 1 patch onto the skin daily.      . simvastatin (ZOCOR) 40 MG tablet TAKE 1 TABLET EVERY EVENING  90 tablet  1  . vitamin C (ASCORBIC ACID) 500  MG tablet Take 500 mg by mouth daily.       No current facility-administered medications on file prior to visit.    Allergies  Allergen Reactions  . Niacin And Related Nausea And Vomiting  . Wellbutrin [Bupropion] Hives    Gi upset    Review of Systems  Review of Systems  Constitutional: Negative for fever and malaise/fatigue.  HENT: Negative for congestion.   Eyes: Negative for discharge.  Respiratory: Negative for shortness of breath.   Cardiovascular: Negative for chest pain, palpitations and leg swelling.  Gastrointestinal: Negative for nausea, abdominal pain and diarrhea.  Genitourinary: Negative for dysuria.  Musculoskeletal: Negative for falls.  Skin: Negative for rash.  Neurological: Negative for loss of consciousness and headaches.  Endo/Heme/Allergies: Negative for polydipsia.  Psychiatric/Behavioral: Negative for depression and suicidal ideas. The patient is not nervous/anxious and does not have insomnia.     Objective  BP 150/82  Pulse 81  Temp(Src) 98.1 F (36.7 C) (Oral)  Ht  5\' 10"  (1.778 m)  Wt 180 lb 1.3 oz (81.684 kg)  BMI 25.84 kg/m2  SpO2 98%  Physical Exam  Physical Exam  Constitutional: He is oriented to person, place, and time and well-developed, well-nourished, and in no distress. No distress.  HENT:  Head: Normocephalic and atraumatic.  Eyes: Conjunctivae are normal.  Neck: Neck supple. No thyromegaly present.  Cardiovascular: Normal rate, regular rhythm and normal heart sounds.   No murmur heard. Pulmonary/Chest: Effort normal and breath sounds normal. No respiratory distress.  Abdominal: He exhibits no distension and no mass. There is no tenderness.  Musculoskeletal: He exhibits no edema.  Neurological: He is alert and oriented to person, place, and time.  Skin: Skin is warm.  Psychiatric: Memory, affect and judgment normal.    Lab Results  Component Value Date   TSH 0.840 02/26/2014   Lab Results  Component Value Date   WBC 5.7 02/26/2014   HGB 12.8* 02/26/2014   HCT 37.2* 02/26/2014   MCV 89.0 02/26/2014   PLT 226 02/26/2014   Lab Results  Component Value Date   CREATININE 0.82 02/26/2014   BUN 12 02/26/2014   NA 142 02/26/2014   K 4.7 02/26/2014   CL 105 02/26/2014   CO2 30 02/26/2014   Lab Results  Component Value Date   ALT 20 02/26/2014   AST 31 02/26/2014   ALKPHOS 46 02/26/2014   BILITOT 0.8 02/26/2014   Lab Results  Component Value Date   CHOL 95 02/26/2014   Lab Results  Component Value Date   HDL 42 02/26/2014   Lab Results  Component Value Date   LDLCALC 40 02/26/2014   Lab Results  Component Value Date   TRIG 63 02/26/2014   Lab Results  Component Value Date   CHOLHDL 2.3 02/26/2014     Assessment & Plan  Hypertension Poorly controlled will alter medications, encouraged DASH diet, minimize caffeine and obtain adequate sleep. Report concerning symptoms and follow up as directed and as needed. Increase Lisinopril 10 mg daily  Hyperglycemia hgba1c acceptable, minimize simple carbs. Increase exercise as  tolerated.  Anemia Check hemoccult and start Hemocyte F daily.  CAD (coronary artery disease) of artery bypass graft asymptomatic

## 2014-03-01 NOTE — Assessment & Plan Note (Signed)
Check hemoccult and start Hemocyte F daily.

## 2014-03-03 ENCOUNTER — Other Ambulatory Visit: Payer: Self-pay | Admitting: Cardiology

## 2014-03-11 ENCOUNTER — Other Ambulatory Visit: Payer: Self-pay | Admitting: Family Medicine

## 2014-03-12 ENCOUNTER — Other Ambulatory Visit: Payer: Self-pay | Admitting: Family Medicine

## 2014-03-16 ENCOUNTER — Other Ambulatory Visit: Payer: Self-pay | Admitting: Family Medicine

## 2014-03-19 ENCOUNTER — Telehealth: Payer: Self-pay

## 2014-03-19 NOTE — Telephone Encounter (Signed)
Opened in error

## 2014-03-20 ENCOUNTER — Telehealth: Payer: Self-pay | Admitting: *Deleted

## 2014-03-20 MED ORDER — FERROUS FUMARATE-FOLIC ACID 324-1 MG PO TABS: 1.0000 | ORAL_TABLET | Freq: Every day | ORAL | Status: DC

## 2014-03-20 NOTE — Telephone Encounter (Signed)
Notified pt's wife. She states they do not have a car so I told her we would mail IFOB, mailed. Rx sent.  Notes Recorded by Mosie Lukes, MD on 02/27/2014 at 8:31 AM Notify labs good, sugar up slightly at 5.9 remind him to watch carbs. Mild anemia has returned, have him complete an IFOB if have not done so in past 6 months and start Hemocyte F 1 tab daily disp #30 with 3 rf

## 2014-03-30 ENCOUNTER — Emergency Department (HOSPITAL_BASED_OUTPATIENT_CLINIC_OR_DEPARTMENT_OTHER)
Admission: EM | Admit: 2014-03-30 | Discharge: 2014-03-30 | Disposition: A | Discharge status: Home / Self Care | Payer: Medicare Other | Attending: Emergency Medicine | Admitting: Emergency Medicine

## 2014-03-30 ENCOUNTER — Encounter (HOSPITAL_BASED_OUTPATIENT_CLINIC_OR_DEPARTMENT_OTHER): Payer: Self-pay | Admitting: Emergency Medicine

## 2014-03-30 DIAGNOSIS — I2510 Atherosclerotic heart disease of native coronary artery without angina pectoris: Secondary | ICD-10-CM | POA: Insufficient documentation

## 2014-03-30 DIAGNOSIS — Z951 Presence of aortocoronary bypass graft: Secondary | ICD-10-CM | POA: Insufficient documentation

## 2014-03-30 DIAGNOSIS — D649 Anemia, unspecified: Secondary | ICD-10-CM | POA: Insufficient documentation

## 2014-03-30 DIAGNOSIS — F329 Major depressive disorder, single episode, unspecified: Secondary | ICD-10-CM | POA: Insufficient documentation

## 2014-03-30 DIAGNOSIS — E785 Hyperlipidemia, unspecified: Secondary | ICD-10-CM | POA: Insufficient documentation

## 2014-03-30 DIAGNOSIS — Z86711 Personal history of pulmonary embolism: Secondary | ICD-10-CM | POA: Insufficient documentation

## 2014-03-30 DIAGNOSIS — Z7902 Long term (current) use of antithrombotics/antiplatelets: Secondary | ICD-10-CM | POA: Insufficient documentation

## 2014-03-30 DIAGNOSIS — Z8673 Personal history of transient ischemic attack (TIA), and cerebral infarction without residual deficits: Secondary | ICD-10-CM | POA: Insufficient documentation

## 2014-03-30 DIAGNOSIS — I10 Essential (primary) hypertension: Secondary | ICD-10-CM | POA: Insufficient documentation

## 2014-03-30 DIAGNOSIS — Z8546 Personal history of malignant neoplasm of prostate: Secondary | ICD-10-CM | POA: Insufficient documentation

## 2014-03-30 DIAGNOSIS — Z8719 Personal history of other diseases of the digestive system: Secondary | ICD-10-CM | POA: Insufficient documentation

## 2014-03-30 DIAGNOSIS — Z79899 Other long term (current) drug therapy: Secondary | ICD-10-CM | POA: Insufficient documentation

## 2014-03-30 DIAGNOSIS — F3289 Other specified depressive episodes: Secondary | ICD-10-CM | POA: Insufficient documentation

## 2014-03-30 DIAGNOSIS — F0390 Unspecified dementia without behavioral disturbance: Secondary | ICD-10-CM | POA: Insufficient documentation

## 2014-03-30 HISTORY — DX: Malignant neoplasm of prostate: C61

## 2014-03-30 LAB — BASIC METABOLIC PANEL
BUN: 14 mg/dL (ref 6–23)
CALCIUM: 9.8 mg/dL (ref 8.4–10.5)
CHLORIDE: 101 meq/L (ref 96–112)
CO2: 28 meq/L (ref 19–32)
Creatinine, Ser: 0.8 mg/dL (ref 0.50–1.35)
GFR calc Af Amer: >90 mL/min (ref >90)
GFR calc non Af Amer: 84 mL/min — ABNORMAL LOW (ref >90)
Glucose, Bld: 97 mg/dL (ref 70–99)
POTASSIUM: 4.2 meq/L (ref 3.7–5.3)
Sodium: 143 mEq/L (ref 137–147)

## 2014-03-30 LAB — CBC WITH DIFFERENTIAL/PLATELET
BASOS ABS: 0 10*3/uL (ref 0.0–0.1)
Basophils Relative: 1 % (ref 0–1)
Eosinophils Absolute: 0.2 10*3/uL (ref 0.0–0.7)
Eosinophils Relative: 4 % (ref 0–5)
HCT: 39.2 % (ref 39.0–52.0)
Hemoglobin: 13.5 g/dL (ref 13.0–17.0)
LYMPHS PCT: 32 % (ref 12–46)
Lymphs Abs: 1.9 10*3/uL (ref 0.7–4.0)
MCH: 31.8 pg (ref 26.0–34.0)
MCHC: 34.4 g/dL (ref 30.0–36.0)
MCV: 92.5 fL (ref 78.0–100.0)
Monocytes Absolute: 0.6 10*3/uL (ref 0.1–1.0)
Monocytes Relative: 9 % (ref 3–12)
NEUTROS ABS: 3.3 10*3/uL (ref 1.7–7.7)
Neutrophils Relative %: 55 % (ref 43–77)
PLATELETS: 207 10*3/uL (ref 150–400)
RBC: 4.24 MIL/uL (ref 4.22–5.81)
RDW: 12.1 % (ref 11.5–15.5)
WBC: 6.1 10*3/uL (ref 4.0–10.5)

## 2014-03-30 MED ORDER — AMLODIPINE BESYLATE 5 MG PO TABS
10.0000 mg | ORAL_TABLET | Freq: Once | ORAL | Status: AC
  Administered 2014-03-30: 10 mg via ORAL
  Filled 2014-03-30: qty 2

## 2014-03-30 MED ORDER — AMLODIPINE BESYLATE 5 MG PO TABS: 5.0000 mg | ORAL_TABLET | Freq: Every day | ORAL | Status: DC

## 2014-03-30 NOTE — ED Notes (Signed)
Family at bedside. 

## 2014-03-30 NOTE — ED Notes (Signed)
RN called [facility] to get medication list of meds the Pt. Takes.  Pharmacist Caryl Pina gave meds and dosages to RN Rosana Hoes

## 2014-03-30 NOTE — ED Provider Notes (Signed)
CSN: [id number]     Arrival date & time 03/30/14  1711 History  This chart was scribed for Orpah Greek, MD by Celesta Gentile, ED Scribe. The patient was seen in room MH03/MH03. Patient's care was started at 5:31 PM.    Chief Complaint  Patient presents with  . Hypertension   The history is provided by the patient and the spouse. No language interpreter was used.   HPI Comments: Samuel Meyers is a 80 y.o. male who presents to the Emergency Department complaining of hypertension.  Pt was brought in by EMS from his clinic.  Pt was seen at the clinic today, where they noticed his BP was extremely high.  His BP currently is 181/83.  Pt states when his BP increases he has a HA.  Pt denies chest pain, SOB, or HA currently.  Pt's wife states they saw Dr. Randel Pigg about 3 weeks ago, where she increased his BP medication.    Past Medical History  Diagnosis Date  . Hypertension   . Hyperlipidemia   . CAD (coronary artery disease)   . Pulmonary embolus     Time of CABG  . Dementia   . Depression   . Stroke     2013--Sept  . Gastroenteritis 10/20/2013  . Anemia 03/01/2014  . Prostate cancer    Past Surgical History  Procedure Laterality Date  . Coronary artery bypass graft      [place]  . External ear surgery    . Tonsillectomy    . Tee without cardioversion  08/15/2012    Procedure: TRANSESOPHAGEAL ECHOCARDIOGRAM (TEE);  Surgeon: Fay Records, MD;  Location: [facility] ENDOSCOPY;  Service: Cardiovascular;  Laterality: N/A;   Family History  Problem Relation Age of Onset  . Coronary artery disease Father     MI in his 7s   History  Substance Use Topics  . Smoking status: Never Smoker   . Smokeless tobacco: Never Used  . Alcohol Use: No    Review of Systems  Constitutional: Negative for fever, chills and diaphoresis.  Respiratory: Negative for chest tightness and shortness of breath.   Cardiovascular: Negative for chest pain.       Hypertension  Gastrointestinal:  Negative for nausea, vomiting, abdominal pain and diarrhea.  Skin: Negative for color change and rash.  Neurological: Negative for dizziness, weakness and headaches.  Psychiatric/Behavioral: Negative for behavioral problems and confusion.  All other systems reviewed and are negative.   Allergies  Niacin and related and Wellbutrin  Home Medications   Prior to Admission medications   Medication Sig Start Date End Date Taking? Authorizing Provider  acetaminophen (TYLENOL) 650 MG CR tablet Take 1,300 mg by mouth daily.    Historical Provider, MD  cholecalciferol (VITAMIN D) 400 UNITS TABS Take 2,000 Units by mouth daily.     Historical Provider, MD  clopidogrel (PLAVIX) 75 MG tablet TAKE 1 TABLET DAILY WITH BREAKFAST 03/11/14   Mosie Lukes, MD  Cyanocobalamin (VITAMIN B-12 CR) 1500 MCG TBCR Take by mouth daily.    Historical Provider, MD  Ferrous Fumarate-Folic Acid (HEMOCYTE-F) 324-1 MG TABS Take 1 tablet by mouth daily. 03/20/14   Mosie Lukes, MD  finasteride (PROSCAR) 5 MG tablet Take 5 mg by mouth daily.    Historical Provider, MD  isosorbide mononitrate (IMDUR) 30 MG 24 hr tablet TAKE 1 TABLET DAILY 03/03/14   Lelon Perla, MD  lisinopril (PRINIVIL,ZESTRIL) 10 MG tablet Take 1 tablet (10 mg total) by mouth daily.  02/26/14   Mosie Lukes, MD  Misc Natural Products (OSTEO BI-FLEX JOINT SHIELD PO) Take 2 tablets by mouth daily.     Historical Provider, MD  Multiple Vitamin (MULTIVITAMIN) capsule Take 1 capsule by mouth daily.    Historical Provider, MD  NAMENDA 10 MG tablet TAKE 1 TABLET TWICE A DAY    Mosie Lukes, MD  Omega-3 Fatty Acids (FP FISH OIL PO) Take 2,400 mg by mouth daily.     Historical Provider, MD  rivastigmine (EXELON) 9.5 mg/24hr Place 1 patch onto the skin daily.    Historical Provider, MD  simvastatin (ZOCOR) 40 MG tablet TAKE 1 TABLET EVERY EVENING 03/16/14   Mosie Lukes, MD  vitamin C (ASCORBIC ACID) 500 MG tablet Take 500 mg by mouth daily.    Historical  Provider, MD   Triage Vitals: BP 181/83  Pulse 56  Temp(Src) 97.8 F (36.6 C) (Oral)  Resp 17  Wt 180 lb (81.647 kg)  SpO2 100%  Physical Exam  Constitutional: He is oriented to person, place, and time. He appears well-developed and well-nourished. No distress.  HENT:  Head: Normocephalic and atraumatic.  Right Ear: Hearing normal.  Left Ear: Hearing normal.  Nose: Nose normal.  Mouth/Throat: Oropharynx is clear and moist and mucous membranes are normal.  Eyes: Conjunctivae and EOM are normal. Pupils are equal, round, and reactive to light.  Neck: Normal range of motion. Neck supple.  Cardiovascular: Regular rhythm, S1 normal and S2 normal.  Exam reveals no gallop and no friction rub.   No murmur heard. Pulmonary/Chest: Effort normal and breath sounds normal. No respiratory distress. He has no wheezes. He has no rales. He exhibits no tenderness.  Abdominal: Soft. Normal appearance and bowel sounds are normal. There is no hepatosplenomegaly. There is no tenderness. There is no rebound, no guarding, no tenderness at McBurney's point and negative Murphy's sign. No hernia.  Musculoskeletal: Normal range of motion.  Neurological: He is alert and oriented to person, place, and time. He has normal strength. No cranial nerve deficit or sensory deficit. Coordination normal. GCS eye subscore is 4. GCS verbal subscore is 5. GCS motor subscore is 6.  Skin: Skin is warm, dry and intact. No rash noted. No cyanosis.  Psychiatric: He has a normal mood and affect. His speech is normal and behavior is normal. Thought content normal.    ED Course  Procedures (including critical care time) DIAGNOSTIC STUDIES: Oxygen Saturation is 100% on RA, normal by my interpretation.    COORDINATION OF CARE: 6:12 PM-Will order CBC and basic metabolic panel.  Will order Norvasc.  Patient informed of current plan of treatment and evaluation and agrees with plan.     Labs Review Labs Reviewed  BASIC METABOLIC  PANEL - Abnormal; Notable for the following:    GFR calc non Af Amer 84 (*)    All other components within normal limits  CBC WITH DIFFERENTIAL    Imaging Review No results found.   EKG Interpretation   Date/Time:  Monday March 30 2014 18:09:27 EDT Ventricular Rate:  52 PR Interval:  258 QRS Duration: 88 QT Interval:  444 QTC Calculation: 412 R Axis:   58 Text Interpretation:  Sinus bradycardia with 1st degree A-V block  Otherwise normal ECG Confirmed by Orin Eberwein  MD, [facility] [phone]) on  03/30/2014 6:15:25 PM      MDM   Final diagnoses:  None   Patient presents to the ER for evaluation of elevated blood pressure. Patient is  asymptomatic hypertension. Patient has a previous history of hypertension, recently was identified as having elevated blood pressures and had lisinopril dose doubled. This does not appear to have been effective. He is hypertensive here in the ER. There are no symptoms, denies headache, blurred vision, chest pain, shortness of breath. Lab work was unremarkable. EKG unremarkable. Patient was administered Norvasc here in the ER and monitored. Blood pressure did come down slightly. Will add this to his regimen, have followup with primary doctor for recheck.  I personally performed the services described in this documentation, which was scribed in my presence. The recorded information has been reviewed and is accurate.       Orpah Greek, MD 03/30/14 2004

## 2014-03-30 NOTE — ED Notes (Signed)
Pt. Lives at [facility] and was noted to have hypertension today in the clinic.  EMS called to [facility] .  Pt. Has no s/s of chest pain or headache.  No reports of pain or shortness of breath.

## 2014-03-30 NOTE — Discharge Instructions (Signed)

## 2014-04-08 ENCOUNTER — Telehealth: Payer: Self-pay | Admitting: Family

## 2014-04-08 ENCOUNTER — Encounter: Payer: Self-pay | Admitting: Family

## 2014-04-08 ENCOUNTER — Ambulatory Visit (INDEPENDENT_AMBULATORY_CARE_PROVIDER_SITE_OTHER): Payer: Medicare Other | Admitting: Family

## 2014-04-08 VITALS — BP 122/70 | HR 63 | Temp 98.5°F | Resp 16 | Ht 70.0 in | Wt 178.1 lb

## 2014-04-08 DIAGNOSIS — I25810 Atherosclerosis of coronary artery bypass graft(s) without angina pectoris: Secondary | ICD-10-CM

## 2014-04-08 DIAGNOSIS — E785 Hyperlipidemia, unspecified: Secondary | ICD-10-CM

## 2014-04-08 DIAGNOSIS — I10 Essential (primary) hypertension: Secondary | ICD-10-CM

## 2014-04-08 MED ORDER — AMLODIPINE BESYLATE 5 MG PO TABS: 5.0000 mg | ORAL_TABLET | Freq: Every day | ORAL | Status: DC

## 2014-04-08 MED ORDER — ATORVASTATIN CALCIUM 20 MG PO TABS: 20.0000 mg | ORAL_TABLET | Freq: Every day | ORAL | Status: DC

## 2014-04-08 NOTE — Assessment & Plan Note (Signed)
Due to potential drug interaction between simvastatin dose and amlodipine, will change simvastatin to atorvastatin. Pt is instructed to follow up in 6 weeks for flp/lft.

## 2014-04-08 NOTE — Telephone Encounter (Signed)
Left detailed message on Samuel Meyers's voicemail and to call if any questions.

## 2014-04-08 NOTE — Progress Notes (Signed)
Subjective:    Patient ID: Samuel Meyers, male    DOB: January 06, 1934, 80 y.o.   MRN: [id number]  HPI  Mr. Samuel Meyers is a 80 yr old male who presents today for ED follow up. Pt was seen on the ED on 03/30/14. Amlodipine was added to his regimen in the ED.  Was sent from [facility].   Denies CP,SOB or swelling. Has been swimming/walking. My follow up manual BP check today is 134/65.  Previous readings in our system recently:  BP Readings from Last 3 Encounters:  04/08/14 122/70  03/30/14 184/88  02/26/14 150/82   Readings from Nurse at [facility] starting 03/31/14 range 155-188/82-122.  Reading this AM documented: 224/100 and 170/90 after 45 minutes of rest.     Review of Systems See HPI  Past Medical History  Diagnosis Date  . Hypertension   . Hyperlipidemia   . CAD (coronary artery disease)   . Pulmonary embolus     Time of CABG  . Dementia   . Depression   . Stroke     2013--Sept  . Gastroenteritis 10/20/2013  . Anemia 03/01/2014  . Prostate cancer     History   Social History  . Marital Status: Married    Spouse Name: N/A    Number of Children: 1  . Years of Education: N/A   Occupational History  . security     Retired   Social History Main Topics  . Smoking status: Never Smoker   . Smokeless tobacco: Never Used  . Alcohol Use: No  . Drug Use: No  . Sexual Activity: Not on file   Other Topics Concern  . Not on file   Social History Narrative  . No narrative on file    Past Surgical History  Procedure Laterality Date  . Coronary artery bypass graft      [place]  . External ear surgery    . Tonsillectomy    . Tee without cardioversion  08/15/2012    Procedure: TRANSESOPHAGEAL ECHOCARDIOGRAM (TEE);  Surgeon: Fay Records, MD;  Location: [facility] ENDOSCOPY;  Service: Cardiovascular;  Laterality: N/A;    Family History  Problem Relation Age of Onset  . Coronary artery disease Father     MI in his 83s    Allergies  Allergen Reactions  . Niacin  And Related Nausea And Vomiting  . Wellbutrin [Bupropion] Hives    Gi upset    Current Outpatient Prescriptions on File Prior to Visit  Medication Sig Dispense Refill  . acetaminophen (TYLENOL) 650 MG CR tablet Take 1,300 mg by mouth daily.      . bimatoprost (LUMIGAN) 0.03 % ophthalmic solution Place 1 drop into both eyes at bedtime.      . cholecalciferol (VITAMIN D) 400 UNITS TABS Take 2,000 Units by mouth daily.       . clopidogrel (PLAVIX) 75 MG tablet TAKE 1 TABLET DAILY WITH BREAKFAST  90 tablet  1  . Cyanocobalamin (VITAMIN B-12 CR) 1500 MCG TBCR Take by mouth daily.      . Ferrous Fumarate-Folic Acid (HEMOCYTE-F) 324-1 MG TABS Take 1 tablet by mouth daily.  90 each  0  . finasteride (PROSCAR) 5 MG tablet Take 5 mg by mouth daily.      . isosorbide mononitrate (IMDUR) 30 MG 24 hr tablet TAKE 1 TABLET DAILY  90 tablet  2  . lisinopril (PRINIVIL,ZESTRIL) 10 MG tablet Take 1 tablet (10 mg total) by mouth daily.  90 tablet  1  . Misc Natural Products (OSTEO BI-FLEX JOINT SHIELD PO) Take 2 tablets by mouth daily.       . Multiple Vitamin (MULTIVITAMIN) capsule Take 1 capsule by mouth daily.      Marland Kitchen NAMENDA 10 MG tablet TAKE 1 TABLET TWICE A DAY  180 tablet  1  . Omega-3 Fatty Acids (FP FISH OIL PO) Take 2,400 mg by mouth daily.       . rivastigmine (EXELON) 9.5 mg/24hr Place 1 patch onto the skin daily.      . simvastatin (ZOCOR) 40 MG tablet TAKE 1 TABLET EVERY EVENING  90 tablet  0  . timolol (BETIMOL) 0.25 % ophthalmic solution Place 1-2 drops into both eyes 2 (two) times daily.      . vitamin C (ASCORBIC ACID) 500 MG tablet Take 500 mg by mouth daily.       No current facility-administered medications on file prior to visit.    BP 122/70  Pulse 63  Temp(Src) 98.5 F (36.9 C) (Oral)  Resp 16  Ht 5\' 10"  (1.778 m)  Wt 178 lb 1.3 oz (80.777 kg)  BMI 25.55 kg/m2  SpO2 97%       Objective:   Physical Exam  Constitutional: He is oriented to person, place, and time. He  appears well-developed and well-nourished. No distress.  HENT:  Head: Normocephalic and atraumatic.  Cardiovascular: Normal rate and regular rhythm.   No murmur heard. Pulmonary/Chest: Effort normal and breath sounds normal. No respiratory distress. He has no wheezes. He has no rales. He exhibits no tenderness.  Musculoskeletal: He exhibits no edema.  Neurological: He is alert and oriented to person, place, and time.  Psychiatric: He has a normal mood and affect. His behavior is normal. Judgment and thought content normal.          Assessment & Plan:

## 2014-04-08 NOTE — Assessment & Plan Note (Signed)
BP looks OK today in the office.  Advised pt to continue amlodipine along with the 10 mg of lisinopril.  Will request that RN continue to check BP daily and contact us if sbp >170.  Pt to follow up with [place] in 2 weeks for bp recheck in our office.

## 2014-04-08 NOTE — Addendum Note (Signed)
Addended by: Debbrah Alar on: 04/08/2014 02:35 PM   Modules accepted: Level of Service

## 2014-04-08 NOTE — Progress Notes (Signed)
Pre visit review using our clinic review tool, if applicable. No additional management support is needed unless otherwise documented below in the visit note. 

## 2014-04-08 NOTE — Patient Instructions (Addendum)
Stop Simvastatin, Start Atorvastatin instead (this rx has been sent to [facility]) [facility]   Please follow up in 2 weeks for nurse visit BP check.  Follow up in 6 weeks for fasting cholesterol and liver function testing.

## 2014-04-08 NOTE — Telephone Encounter (Signed)
Please call Nira Conn ? Evans, Therapist, sports at [facility] and give verbal order to have them check blood pressure once daily for 1 week. Call if SBP>170. Call us in 1 week with all readings.  [phone]

## 2014-04-15 ENCOUNTER — Other Ambulatory Visit (INDEPENDENT_AMBULATORY_CARE_PROVIDER_SITE_OTHER): Payer: Medicare Other

## 2014-04-15 ENCOUNTER — Telehealth: Payer: Self-pay | Admitting: *Deleted

## 2014-04-15 DIAGNOSIS — D649 Anemia, unspecified: Secondary | ICD-10-CM

## 2014-04-15 LAB — FECAL OCCULT BLOOD, IMMUNOCHEMICAL: FECAL OCCULT BLD: NEGATIVE

## 2014-04-15 NOTE — Telephone Encounter (Signed)
Message copied by Ronny Flurry on Wed Apr 15, 2014  1:05 PM ------      Message from: Irven Baltimore      Created: Wed Apr 15, 2014  1:01 PM       [facility] needs an order for an IFOB for this patient. Thanks!        ------

## 2014-04-15 NOTE — Telephone Encounter (Signed)
Order placed

## 2014-04-21 ENCOUNTER — Telehealth: Payer: Self-pay | Admitting: *Deleted

## 2014-04-21 DIAGNOSIS — E785 Hyperlipidemia, unspecified: Secondary | ICD-10-CM

## 2014-04-21 NOTE — Telephone Encounter (Signed)
Lab orders entered

## 2014-04-21 NOTE — Telephone Encounter (Signed)
Message copied by Ronny Flurry on Tue Apr 21, 2014  4:48 PM ------      Message from: O'SULLIVAN, MELISSA      Created: Wed Apr 08, 2014  2:11 PM       Pt will come in 6 weeks for FLP/LFT dx hyperlipidemia       ------

## 2014-04-23 ENCOUNTER — Ambulatory Visit (INDEPENDENT_AMBULATORY_CARE_PROVIDER_SITE_OTHER): Payer: Medicare Other | Admitting: Family Medicine

## 2014-04-23 DIAGNOSIS — I10 Essential (primary) hypertension: Secondary | ICD-10-CM

## 2014-04-23 DIAGNOSIS — F0390 Unspecified dementia without behavioral disturbance: Secondary | ICD-10-CM

## 2014-04-23 MED ORDER — LISINOPRIL 5 MG PO TABS: 10.0000 mg | ORAL_TABLET | Freq: Two times a day (BID) | ORAL | Status: DC

## 2014-04-23 NOTE — Patient Instructions (Signed)

## 2014-04-23 NOTE — Progress Notes (Signed)
   Subjective:    Patient ID: Samuel Meyers, male    DOB: Aug 06, 1934, 80 y.o.   MRN: [id number]  HPI    Review of Systems     Objective:   Physical Exam        Assessment & Plan:  Orthostatic Blood Pressure: Blood pressure:   lying 153/81, sitting 168/83, standing 145/82 Pulse:   lying 56, sitting 55, standing 62

## 2014-04-24 ENCOUNTER — Other Ambulatory Visit: Payer: Self-pay | Admitting: Family Medicine

## 2014-04-24 ENCOUNTER — Ambulatory Visit: Payer: Medicare Other | Admitting: Cardiology

## 2014-04-24 ENCOUNTER — Telehealth: Payer: Self-pay | Admitting: Family Medicine

## 2014-04-24 DIAGNOSIS — F0391 Unspecified dementia with behavioral disturbance: Secondary | ICD-10-CM

## 2014-04-24 DIAGNOSIS — F03918 Unspecified dementia, unspecified severity, with other behavioral disturbance: Secondary | ICD-10-CM

## 2014-04-24 LAB — RENAL FUNCTION PANEL
ALBUMIN: 4.5 g/dL (ref 3.5–5.2)
BUN: 14 mg/dL (ref 6–23)
CHLORIDE: 105 meq/L (ref 96–112)
CO2: 27 mEq/L (ref 19–32)
Calcium: 9.6 mg/dL (ref 8.4–10.5)
Creat: 0.8 mg/dL (ref 0.50–1.35)
Glucose, Bld: 77 mg/dL (ref 70–99)
Phosphorus: 3.7 mg/dL (ref 2.3–4.6)
Potassium: 4.2 mEq/L (ref 3.5–5.3)
Sodium: 141 mEq/L (ref 135–145)

## 2014-04-24 NOTE — Telephone Encounter (Signed)
Requesting referral to Neurologist in [facility]

## 2014-04-26 ENCOUNTER — Encounter: Payer: Self-pay | Admitting: Family Medicine

## 2014-04-26 NOTE — Progress Notes (Signed)
Patient ID: Samuel Meyers, male   DOB: 12/11/1933, 80 y.o.   MRN: [id number] RAMIEL FORTI [id number] September 16, 1934 04/26/2014      Progress Note-Follow Up  Subjective  Chief Complaint  Chief Complaint  Patient presents with  . BP check    HPI  Patient is a 80 year old male in today for routine medical care. He is in today with his wife and daughter His memory continues to worsen and is becoming more paranoid that his wife is against him. No recent illness. Denies CP/palp/SOB/HA/congestion/fevers/GI or GU c/o. Taking meds as prescribed  Past Medical History  Diagnosis Date  . Hypertension   . Hyperlipidemia   . CAD (coronary artery disease)   . Pulmonary embolus     Time of CABG  . Dementia   . Depression   . Stroke     2013--Sept  . Gastroenteritis 10/20/2013  . Anemia 03/01/2014  . Prostate cancer     Past Surgical History  Procedure Laterality Date  . Coronary artery bypass graft      [place]  . External ear surgery    . Tonsillectomy    . Tee without cardioversion  08/15/2012    Procedure: TRANSESOPHAGEAL ECHOCARDIOGRAM (TEE);  Surgeon: Fay Records, MD;  Location: [facility] ENDOSCOPY;  Service: Cardiovascular;  Laterality: N/A;    Family History  Problem Relation Age of Onset  . Coronary artery disease Father     MI in his 74s    History   Social History  . Marital Status: Married    Spouse Name: N/A    Number of Children: 1  . Years of Education: N/A   Occupational History  . security     Retired   Social History Main Topics  . Smoking status: Never Smoker   . Smokeless tobacco: Never Used  . Alcohol Use: No  . Drug Use: No  . Sexual Activity: Not on file   Other Topics Concern  . Not on file   Social History Narrative  . No narrative on file    Current Outpatient Prescriptions on File Prior to Visit  Medication Sig Dispense Refill  . acetaminophen (TYLENOL) 650 MG CR tablet Take 1,300 mg by mouth daily.      Marland Kitchen amLODipine (NORVASC) 5 MG  tablet Take 1 tablet (5 mg total) by mouth daily.  90 tablet  0  . atorvastatin (LIPITOR) 20 MG tablet Take 1 tablet (20 mg total) by mouth daily.  30 tablet  1  . bimatoprost (LUMIGAN) 0.03 % ophthalmic solution Place 1 drop into both eyes at bedtime.      . cholecalciferol (VITAMIN D) 400 UNITS TABS Take 2,000 Units by mouth daily.       . clopidogrel (PLAVIX) 75 MG tablet TAKE 1 TABLET DAILY WITH BREAKFAST  90 tablet  1  . Cyanocobalamin (VITAMIN B-12 CR) 1500 MCG TBCR Take by mouth daily.      . Ferrous Fumarate-Folic Acid (HEMOCYTE-F) 324-1 MG TABS Take 1 tablet by mouth daily.  90 each  0  . finasteride (PROSCAR) 5 MG tablet Take 5 mg by mouth daily.      . isosorbide mononitrate (IMDUR) 30 MG 24 hr tablet TAKE 1 TABLET DAILY  90 tablet  2  . Misc Natural Products (OSTEO BI-FLEX JOINT SHIELD PO) Take 2 tablets by mouth daily.       . Multiple Vitamin (MULTIVITAMIN) capsule Take 1 capsule by mouth daily.      Marland Kitchen  NAMENDA 10 MG tablet TAKE 1 TABLET TWICE A DAY  180 tablet  1  . Omega-3 Fatty Acids (FP FISH OIL PO) Take 2,400 mg by mouth daily.       . rivastigmine (EXELON) 9.5 mg/24hr Place 1 patch onto the skin daily.      . timolol (BETIMOL) 0.25 % ophthalmic solution Place 1-2 drops into both eyes 2 (two) times daily.      . vitamin C (ASCORBIC ACID) 500 MG tablet Take 500 mg by mouth daily.       No current facility-administered medications on file prior to visit.    Allergies  Allergen Reactions  . Niacin And Related Nausea And Vomiting  . Wellbutrin [Bupropion] Hives    Gi upset    Review of Systems  Review of Systems  Constitutional: Negative for fever and malaise/fatigue.  HENT: Negative for congestion.   Eyes: Negative for discharge.  Respiratory: Negative for shortness of breath.   Cardiovascular: Negative for chest pain, palpitations and leg swelling.  Gastrointestinal: Negative for nausea, abdominal pain and diarrhea.  Genitourinary: Negative for dysuria.   Musculoskeletal: Negative for back pain and falls.  Skin: Negative for rash.  Neurological: Negative for loss of consciousness and headaches.  Endo/Heme/Allergies: Negative for polydipsia.  Psychiatric/Behavioral: Positive for memory loss. Negative for depression and suicidal ideas. The patient is not nervous/anxious and does not have insomnia.     Objective  There were no vitals taken for this visit.  Physical Exam  Physical Exam  Constitutional: He is oriented to person, place, and time and well-developed, well-nourished, and in no distress. No distress.  HENT:  Head: Normocephalic and atraumatic.  Eyes: Conjunctivae are normal.  Neck: Neck supple. No thyromegaly present.  Cardiovascular: Normal rate, regular rhythm and normal heart sounds.   Pulmonary/Chest: Effort normal and breath sounds normal. No respiratory distress.  Abdominal: He exhibits no distension and no mass. There is no tenderness.  Musculoskeletal: He exhibits no edema.  Neurological: He is alert and oriented to person, place, and time.  Skin: Skin is warm.  Psychiatric: Memory, affect and judgment normal.    Lab Results  Component Value Date   TSH 0.840 02/26/2014   Lab Results  Component Value Date   WBC 6.1 03/30/2014   HGB 13.5 03/30/2014   HCT 39.2 03/30/2014   MCV 92.5 03/30/2014   PLT 207 03/30/2014   Lab Results  Component Value Date   CREATININE 0.80 04/23/2014   BUN 14 04/23/2014   NA 141 04/23/2014   K 4.2 04/23/2014   CL 105 04/23/2014   CO2 27 04/23/2014   Lab Results  Component Value Date   ALT 20 02/26/2014   AST 31 02/26/2014   ALKPHOS 46 02/26/2014   BILITOT 0.8 02/26/2014   Lab Results  Component Value Date   CHOL 95 02/26/2014   Lab Results  Component Value Date   HDL 42 02/26/2014   Lab Results  Component Value Date   LDLCALC 40 02/26/2014   Lab Results  Component Value Date   TRIG 63 02/26/2014   Lab Results  Component Value Date   CHOLHDL 2.3 02/26/2014     Assessment  & Plan  Hypertension Log from home confirms elevated numbers at home as well. Will increase Lisinopril to 10 mg twice a day.   Dementia Having behavior changes and increased paranoia. Referred to neurology at [facility] for further consideration

## 2014-04-26 NOTE — Assessment & Plan Note (Signed)
Having behavior changes and increased paranoia. Referred to neurology at [facility] for further consideration

## 2014-04-26 NOTE — Assessment & Plan Note (Signed)
Log from home confirms elevated numbers at home as well. Will increase Lisinopril to 10 mg twice a day.

## 2014-04-30 ENCOUNTER — Ambulatory Visit: Payer: Medicare Other | Admitting: Pulmonary Disease

## 2014-05-04 ENCOUNTER — Telehealth: Payer: Self-pay | Admitting: Family Medicine

## 2014-05-04 MED ORDER — ATORVASTATIN CALCIUM 20 MG PO TABS: 20.0000 mg | ORAL_TABLET | Freq: Every day | ORAL | Status: DC

## 2014-05-04 NOTE — Telephone Encounter (Signed)
Patient wife is requesting a refill of atorvastatin to be sent to [facility]

## 2014-05-14 ENCOUNTER — Ambulatory Visit (INDEPENDENT_AMBULATORY_CARE_PROVIDER_SITE_OTHER): Payer: Medicare Other | Admitting: Pulmonary Disease

## 2014-05-14 ENCOUNTER — Encounter: Payer: Self-pay | Admitting: Pulmonary Disease

## 2014-05-14 VITALS — BP 128/66 | HR 65 | Ht 70.0 in | Wt 175.0 lb

## 2014-05-14 DIAGNOSIS — I25810 Atherosclerosis of coronary artery bypass graft(s) without angina pectoris: Secondary | ICD-10-CM

## 2014-05-14 DIAGNOSIS — G4733 Obstructive sleep apnea (adult) (pediatric): Secondary | ICD-10-CM

## 2014-05-14 NOTE — Patient Instructions (Signed)
Send in the chip to [facility] so we can look at report Stay on same settings

## 2014-05-14 NOTE — Assessment & Plan Note (Signed)
Send in the chip to [facility] so we can look at report Stay on same settings  Good compliance - compliance with goal of at least 4-6 hrs every night is the expectation. Advised against medications with sedative side effects Cautioned against driving when sleepy - understanding that sleepiness will vary on a day to day basis

## 2014-05-14 NOTE — Progress Notes (Signed)
   Subjective:    Patient ID: Samuel Meyers, male    DOB: 02/06/1934, 80 y.o.   MRN: [id number]  HPI  80 year old for FU of OSA  He  [place] He has hypertension, hyperlipidemia, coronary artery disease status post CABG and stroke with residual aphasia.  Sleep study in 9/10 showed moderate OSA with AHI 16/h, RDI 24/h with desatn to 84%. Most of the events occurred while supine. Positional therapy was tried initially  After a cpap titration study (required upto 16 cm ) he was placed on Aut[employer]et 8-16 cm [facility]) download 04/2012 shows good compliance > 8h, avg pr 12 cm, no residuals     05/14/2014 Chief Complaint  Patient presents with  . Follow-up    Wears cpap about 8 hours every night. No complaints with mask or supplies.       remains on auto settings Currently using CPAP every night for 10 hours. Denies any problems the mask or machine.  Much improved- better rested, mask ok, pressure ok, no dryness  Memory improved     Review of Systems neg for any significant sore throat, dysphagia, itching, sneezing, nasal congestion or excess/ purulent secretions, fever, chills, sweats, unintended wt loss, pleuritic or exertional cp, hempoptysis, orthopnea pnd or change in chronic leg swelling. Also denies presyncope, palpitations, heartburn, abdominal pain, nausea, vomiting, diarrhea or change in bowel or urinary habits, dysuria,hematuria, rash, arthralgias, visual complaints, headache, numbness weakness or ataxia.     Objective:   Physical Exam  Gen. Pleasant, well-nourished, in no distress ENT - no lesions, no post nasal drip Neck: No JVD, no thyromegaly, no carotid bruits Lungs: no use of accessory muscles, no dullness to percussion, clear without rales or rhonchi  Cardiovascular: Rhythm regular, heart sounds  normal, no murmurs or gallops, no peripheral edema Musculoskeletal: No deformities, no cyanosis or clubbing        Assessment & Plan:

## 2014-05-20 ENCOUNTER — Encounter: Payer: Self-pay | Admitting: Family Medicine

## 2014-05-20 ENCOUNTER — Ambulatory Visit (INDEPENDENT_AMBULATORY_CARE_PROVIDER_SITE_OTHER): Payer: Medicare Other | Admitting: Family Medicine

## 2014-05-20 VITALS — BP 138/72 | HR 59 | Temp 98.0°F | Ht 70.0 in | Wt 175.0 lb

## 2014-05-20 DIAGNOSIS — F0391 Unspecified dementia with behavioral disturbance: Secondary | ICD-10-CM

## 2014-05-20 DIAGNOSIS — E785 Hyperlipidemia, unspecified: Secondary | ICD-10-CM

## 2014-05-20 DIAGNOSIS — R739 Hyperglycemia, unspecified: Secondary | ICD-10-CM

## 2014-05-20 DIAGNOSIS — I10 Essential (primary) hypertension: Secondary | ICD-10-CM

## 2014-05-20 DIAGNOSIS — H9190 Unspecified hearing loss, unspecified ear: Secondary | ICD-10-CM

## 2014-05-20 DIAGNOSIS — F03918 Unspecified dementia, unspecified severity, with other behavioral disturbance: Secondary | ICD-10-CM

## 2014-05-20 DIAGNOSIS — R7309 Other abnormal glucose: Secondary | ICD-10-CM

## 2014-05-20 DIAGNOSIS — I25810 Atherosclerosis of coronary artery bypass graft(s) without angina pectoris: Secondary | ICD-10-CM

## 2014-05-20 NOTE — Progress Notes (Signed)
Patient ID: Samuel Meyers, male   DOB: 08-17-1934, 80 y.o.   MRN: [id number] Samuel Meyers [id number] 1934-11-30 05/20/2014      Progress Note-Follow Up  Subjective  Chief Complaint  Chief Complaint  Patient presents with  . Follow-up    3 month    HPI  Patient is a 80 year old male in today for routine medical care. Today accompanied by his wife and his daughter. He is worsening. He is becoming somewhat paranoid. They are seeing neurology later this week. He otherwise feels well. No recent illness. No acute concerns. Denies CP/palp/SOB/HA/congestion/fevers/GI or GU c/o. Taking meds as prescribed  Past Medical History  Diagnosis Date  . Hypertension   . Hyperlipidemia   . CAD (coronary artery disease)   . Pulmonary embolus     Time of CABG  . Dementia   . Depression   . Stroke     2013--Sept  . Gastroenteritis 10/20/2013  . Anemia 03/01/2014  . Prostate cancer     Past Surgical History  Procedure Laterality Date  . Coronary artery bypass graft      [place]  . External ear surgery    . Tonsillectomy    . Tee without cardioversion  08/15/2012    Procedure: TRANSESOPHAGEAL ECHOCARDIOGRAM (TEE);  Surgeon: Fay Records, MD;  Location: [facility] ENDOSCOPY;  Service: Cardiovascular;  Laterality: N/A;    Family History  Problem Relation Age of Onset  . Coronary artery disease Father     MI in his 74s    History   Social History  . Marital Status: Married    Spouse Name: N/A    Number of Children: 1  . Years of Education: N/A   Occupational History  . security     Retired   Social History Main Topics  . Smoking status: Never Smoker   . Smokeless tobacco: Never Used  . Alcohol Use: No  . Drug Use: No  . Sexual Activity: Not on file   Other Topics Concern  . Not on file   Social History Narrative  . No narrative on file    Current Outpatient Prescriptions on File Prior to Visit  Medication Sig Dispense Refill  . acetaminophen (TYLENOL) 650 MG CR  tablet Take 1,300 mg by mouth daily.      Marland Kitchen amLODipine (NORVASC) 5 MG tablet Take 1 tablet (5 mg total) by mouth daily.  90 tablet  0  . atorvastatin (LIPITOR) 20 MG tablet Take 1 tablet (20 mg total) by mouth daily.  90 tablet  0  . bimatoprost (LUMIGAN) 0.03 % ophthalmic solution Place 1 drop into both eyes at bedtime.      . cholecalciferol (VITAMIN D) 400 UNITS TABS Take 2,000 Units by mouth daily.       . clopidogrel (PLAVIX) 75 MG tablet TAKE 1 TABLET DAILY WITH BREAKFAST  90 tablet  1  . Cyanocobalamin (VITAMIN B-12 CR) 1500 MCG TBCR Take by mouth daily.      . Ferrous Fumarate-Folic Acid (HEMOCYTE-F) 324-1 MG TABS Take 1 tablet by mouth daily.  90 each  0  . finasteride (PROSCAR) 5 MG tablet Take 5 mg by mouth daily.      . isosorbide mononitrate (IMDUR) 30 MG 24 hr tablet TAKE 1 TABLET DAILY  90 tablet  2  . lisinopril (PRINIVIL,ZESTRIL) 5 MG tablet Take 2 tablets (10 mg total) by mouth 2 (two) times daily.  360 tablet  1  . Misc Natural Products (OSTEO  BI-FLEX JOINT SHIELD PO) Take 2 tablets by mouth daily.       . Multiple Vitamin (MULTIVITAMIN) capsule Take 1 capsule by mouth daily.      Marland Kitchen NAMENDA 10 MG tablet TAKE 1 TABLET TWICE A DAY  180 tablet  1  . Omega-3 Fatty Acids (FP FISH OIL PO) Take 2,400 mg by mouth daily.       . rivastigmine (EXELON) 9.5 mg/24hr Place 1 patch onto the skin daily.      . timolol (BETIMOL) 0.25 % ophthalmic solution Place 1-2 drops into both eyes 2 (two) times daily.      . vitamin C (ASCORBIC ACID) 500 MG tablet Take 500 mg by mouth daily.       No current facility-administered medications on file prior to visit.    Allergies  Allergen Reactions  . Niacin And Related Nausea And Vomiting  . Wellbutrin [Bupropion] Hives    Gi upset    Review of Systems  Review of Systems  Constitutional: Positive for malaise/fatigue. Negative for fever.  HENT: Positive for hearing loss. Negative for congestion.   Eyes: Negative for discharge.  Respiratory:  Negative for shortness of breath.   Cardiovascular: Negative for chest pain, palpitations and leg swelling.  Gastrointestinal: Negative for nausea, abdominal pain and diarrhea.  Genitourinary: Negative for dysuria.  Musculoskeletal: Negative for falls.  Skin: Negative for rash.  Neurological: Negative for loss of consciousness and headaches.  Endo/Heme/Allergies: Negative for polydipsia.  Psychiatric/Behavioral: Positive for memory loss. Negative for depression and suicidal ideas. The patient is nervous/anxious. The patient does not have insomnia.     Objective  BP 138/72  Pulse 59  Temp(Src) 98 F (36.7 C) (Oral)  Ht 5\' 10"  (1.778 m)  Wt 175 lb (79.379 kg)  BMI 25.11 kg/m2  SpO2 99%  Physical Exam  Physical Exam  Constitutional: He is oriented to person, place, and time and well-developed, well-nourished, and in no distress. No distress.  HENT:  Head: Normocephalic and atraumatic.  Eyes: Conjunctivae are normal.  Neck: Neck supple. No thyromegaly present.  Cardiovascular: Normal rate, regular rhythm and normal heart sounds.   No murmur heard. Pulmonary/Chest: Effort normal and breath sounds normal. No respiratory distress.  Abdominal: He exhibits no distension and no mass. There is no tenderness.  Musculoskeletal: He exhibits no edema.  Neurological: He is alert and oriented to person, place, and time.  Skin: Skin is warm.  Psychiatric: Memory, affect and judgment normal.    Lab Results  Component Value Date   TSH 0.840 02/26/2014   Lab Results  Component Value Date   WBC 6.1 03/30/2014   HGB 13.5 03/30/2014   HCT 39.2 03/30/2014   MCV 92.5 03/30/2014   PLT 207 03/30/2014   Lab Results  Component Value Date   CREATININE 0.80 04/23/2014   BUN 14 04/23/2014   NA 141 04/23/2014   K 4.2 04/23/2014   CL 105 04/23/2014   CO2 27 04/23/2014   Lab Results  Component Value Date   ALT 20 02/26/2014   AST 31 02/26/2014   ALKPHOS 46 02/26/2014   BILITOT 0.8 02/26/2014   Lab  Results  Component Value Date   CHOL 95 02/26/2014   Lab Results  Component Value Date   HDL 42 02/26/2014   Lab Results  Component Value Date   LDLCALC 40 02/26/2014   Lab Results  Component Value Date   TRIG 63 02/26/2014   Lab Results  Component Value Date   CHOLHDL  2.3 02/26/2014    Assessment & Plan   Hypertension Well controlled, no changes to meds. Encouraged heart healthy diet such as the DASH diet and exercise as tolerated.   CAD (coronary artery disease) of artery bypass graft Asymptomatic, follows with cardiology  Dementia Worsening, has appt with neurology this week to discuss options  Hyperglycemia  minimize simple carbs. Increase exercise as tolerated.   Hearing loss Referred to audiology.

## 2014-05-20 NOTE — Progress Notes (Signed)
Pre visit review using our clinic review tool, if applicable. No additional management support is needed unless otherwise documented below in the visit note. 

## 2014-05-20 NOTE — Patient Instructions (Signed)
Dementia Dementia is a general term for problems with brain function. A person with dementia has memory loss and a hard time with at least one other brain function such as thinking, speaking, or problem solving. Dementia can affect social functioning, how you do your job, your mood, or your personality. The changes may be hidden for a long time. The earliest forms of this disease are usually not detected by family or friends. Dementia can be:  Irreversible.  Potentially reversible.  Partially reversible.  Progressive. This means it can get worse over time. CAUSES  Irreversible dementia causes may include:  Degeneration of brain cells (Alzheimer's disease or lewy body dementia).  Multiple small strokes (vascular dementia).  Infection (chronic meningitis or Creutzfelt-Jakob disease).  Frontotemporal dementia. This affects younger people, age 40 to 70, compared to those who have Alzheimer's disease.  Dementia associated with other disorders like Parkinson's disease, Huntington's disease, or HIV-associated dementia. Potentially or partially reversible dementia causes may include:  Medicines.  Metabolic causes such as excessive alcohol intake, vitamin B12 deficiency, or thyroid disease.  Masses or pressure in the brain such as a tumor, blood clot, or hydrocephalus. SYMPTOMS  Symptoms are often hard to detect. Family members or coworkers may not notice them early in the disease process. Different people with dementia may have different symptoms. Symptoms can include:  A hard time with memory, especially recent memory. Long-term memory may not be impaired.  Asking the same question multiple times or forgetting something someone just said.  A hard time speaking your thoughts or finding certain words.  A hard time solving problems or performing familiar tasks (such as how to use a telephone).  Sudden changes in mood.  Changes in personality, especially increasing moodiness or  mistrust.  Depression.  A hard time understanding complex ideas that were never a problem in the past. DIAGNOSIS  There are no specific tests for dementia.   Your caregiver may recommend a thorough evaluation. This is because some forms of dementia can be reversible. The evaluation will likely include a physical exam and getting a detailed history from you and a family member. The history often gives the best clues and suggestions for a diagnosis.  Memory testing may be done. A detailed brain function evaluation called neuropsychologic testing may be helpful.  Lab tests and brain imaging (such as a CT scan or MRI scan) are sometimes important.  Sometimes observation and re-evaluation over time is very helpful. TREATMENT  Treatment depends on the cause.   If the problem is a vitamin deficiency, it may be helped or cured with supplements.  For dementias such as Alzheimer's disease, medicines are available to stabilize or slow the course of the disease. There are no cures for this type of dementia.  Your caregiver can help direct you to groups, organizations, and other caregivers to help with decisions in the care of you or your loved one. HOME CARE INSTRUCTIONS The care of individuals with dementia is varied and dependent upon the progression of the dementia. The following suggestions are intended for the person living with, or caring for, the person with dementia.  Create a safe environment.  Remove the locks on bathroom doors to prevent the person from accidentally locking himself or herself in.  Use childproof latches on kitchen cabinets and any place where cleaning supplies, chemicals, or alcohol are kept.  Use childproof covers in unused electrical outlets.  Install childproof devices to keep doors and windows secured.  Remove stove knobs or install safety   knobs and an automatic shut-off on the stove.  Lower the temperature on water heaters.  Label medicines and keep them  locked up.  Secure knives, lighters, matches, power tools, and guns, and keep these items out of reach.  Keep the house free from clutter. Remove rugs or anything that might contribute to a fall.  Remove objects that might break and hurt the person.  Make sure lighting is good, both inside and outside.  Install grab rails as needed.  Use a monitoring device to alert you to falls or other needs for help.  Reduce confusion.  Keep familiar objects and people around.  Use night lights or dim lights at night.  Label items or areas.  Use reminders, notes, or directions for daily activities or tasks.  Keep a simple, consistent routine for waking, meals, bathing, dressing, and bedtime.  Create a calm, quiet environment.  Place large clocks and calendars prominently.  Display emergency numbers and home address near all telephones.  Use cues to establish different times of the day. An example is to open curtains to let the natural light in during the day.   Use effective communication.  Choose simple words and short sentences.  Use a gentle, calm tone of voice.  Be careful not to interrupt.  If the person is struggling to find a word or communicate a thought, try to provide the word or thought.  Ask one question at a time. Allow the person ample time to answer questions. Repeat the question again if the person does not respond.  Reduce nighttime restlessness.  Provide a comfortable bed.  Have a consistent nighttime routine.  Ensure a regular walking or physical activity schedule. Involve the person in daily activities as much as possible.  Limit napping during the day.  Limit caffeine.  Attend social events that stimulate rather than overwhelm the senses.  Encourage good nutrition and hydration.  Reduce distractions during meal times and snacks.  Avoid foods that are too hot or too cold.  Monitor chewing and swallowing ability.  Continue with routine vision,  hearing, dental, and medical screenings.  Only give over-the-counter or prescription medicines as directed by the caregiver.  Monitor driving abilities. Do not allow the person to drive when safe driving is no longer possible.  Register with an identification program which could provide location assistance in the event of a missing person situation. SEEK MEDICAL CARE IF:   New behavioral problems start such as moodiness, aggressiveness, or seeing things that are not there (hallucinations).  Any new problem with brain function happens. This includes problems with balance, speech, or falling a lot.  Problems with swallowing develop.  Any symptoms of other illness happen. Small changes or worsening in any aspect of brain function can be a sign that the illness is getting worse. It can also be a sign of another medical illness such as infection. Seeing a caregiver right away is important. SEEK IMMEDIATE MEDICAL CARE IF:   A fever develops.  New or worsened confusion develops.  New or worsened sleepiness develops.  Staying awake becomes hard to do. Document Released: 05/16/2001 Document Revised: 02/12/2012 Document Reviewed: 04/17/2011 Exit[employer] Patient Information 2015 [employer]. This information is not intended to replace advice given to you by your health care provider. Make sure you discuss any questions you have with your health care provider.  

## 2014-05-29 ENCOUNTER — Encounter: Payer: Self-pay | Admitting: Family Medicine

## 2014-05-29 DIAGNOSIS — H9190 Unspecified hearing loss, unspecified ear: Secondary | ICD-10-CM | POA: Insufficient documentation

## 2014-05-29 NOTE — Assessment & Plan Note (Addendum)
minimize simple carbs. Increase exercise as tolerated.  

## 2014-05-29 NOTE — Assessment & Plan Note (Signed)
Well controlled, no changes to meds. Encouraged heart healthy diet such as the DASH diet and exercise as tolerated.  °

## 2014-05-29 NOTE — Assessment & Plan Note (Signed)
Asymptomatic, follows with cardiology 

## 2014-05-29 NOTE — Assessment & Plan Note (Signed)
Referred to audiology

## 2014-05-29 NOTE — Assessment & Plan Note (Signed)
Worsening, has appt with neurology this week to discuss options

## 2014-05-30 ENCOUNTER — Other Ambulatory Visit: Payer: Self-pay | Admitting: Family Medicine

## 2014-06-10 ENCOUNTER — Telehealth: Payer: Self-pay | Admitting: Family Medicine

## 2014-06-10 NOTE — Telephone Encounter (Signed)
Wife left message requesting refill of Hemocyte-F 324mg  tabs be called in to [facility]

## 2014-06-10 NOTE — Telephone Encounter (Signed)
Refill previously sent on 06/01/14. Advised pt's wife to call [facility] to check on status and she voices understanding.

## 2014-06-12 ENCOUNTER — Ambulatory Visit: Payer: Medicare Other | Admitting: Family Medicine

## 2014-06-18 ENCOUNTER — Other Ambulatory Visit: Payer: Self-pay | Admitting: Family

## 2014-06-18 NOTE — Telephone Encounter (Signed)
Rx request to pharmacy/SLS  

## 2014-07-06 ENCOUNTER — Telehealth: Payer: Self-pay | Admitting: *Deleted

## 2014-07-06 MED ORDER — ATORVASTATIN CALCIUM 20 MG PO TABS: 20.0000 mg | ORAL_TABLET | Freq: Every day | ORAL | Status: DC

## 2014-07-06 NOTE — Telephone Encounter (Signed)
Received message from pt's wife requesting refill of atorvastatin to [facility] for pt. Last rx 05/04/14, #90. Refill sent.

## 2014-07-20 ENCOUNTER — Encounter: Payer: Self-pay | Admitting: Family Medicine

## 2014-08-02 ENCOUNTER — Other Ambulatory Visit: Payer: Self-pay | Admitting: Family Medicine

## 2014-08-04 ENCOUNTER — Other Ambulatory Visit: Payer: Self-pay | Admitting: Family Medicine

## 2014-08-09 ENCOUNTER — Emergency Department (HOSPITAL_BASED_OUTPATIENT_CLINIC_OR_DEPARTMENT_OTHER): Payer: Medicare Other

## 2014-08-09 ENCOUNTER — Encounter (HOSPITAL_BASED_OUTPATIENT_CLINIC_OR_DEPARTMENT_OTHER): Payer: Self-pay | Admitting: Emergency Medicine

## 2014-08-09 ENCOUNTER — Emergency Department (HOSPITAL_BASED_OUTPATIENT_CLINIC_OR_DEPARTMENT_OTHER)
Admission: EM | Admit: 2014-08-09 | Discharge: 2014-08-09 | Disposition: A | Discharge status: Home / Self Care | Payer: Medicare Other | Attending: Emergency Medicine | Admitting: Emergency Medicine

## 2014-08-09 DIAGNOSIS — Z7902 Long term (current) use of antithrombotics/antiplatelets: Secondary | ICD-10-CM | POA: Insufficient documentation

## 2014-08-09 DIAGNOSIS — R112 Nausea with vomiting, unspecified: Secondary | ICD-10-CM | POA: Diagnosis not present

## 2014-08-09 DIAGNOSIS — Z79899 Other long term (current) drug therapy: Secondary | ICD-10-CM | POA: Diagnosis not present

## 2014-08-09 DIAGNOSIS — I10 Essential (primary) hypertension: Secondary | ICD-10-CM | POA: Diagnosis not present

## 2014-08-09 DIAGNOSIS — F0390 Unspecified dementia without behavioral disturbance: Secondary | ICD-10-CM | POA: Diagnosis not present

## 2014-08-09 DIAGNOSIS — Z8719 Personal history of other diseases of the digestive system: Secondary | ICD-10-CM | POA: Diagnosis not present

## 2014-08-09 DIAGNOSIS — Z951 Presence of aortocoronary bypass graft: Secondary | ICD-10-CM | POA: Diagnosis not present

## 2014-08-09 DIAGNOSIS — I2510 Atherosclerotic heart disease of native coronary artery without angina pectoris: Secondary | ICD-10-CM | POA: Insufficient documentation

## 2014-08-09 DIAGNOSIS — Z8546 Personal history of malignant neoplasm of prostate: Secondary | ICD-10-CM | POA: Diagnosis not present

## 2014-08-09 DIAGNOSIS — Z86711 Personal history of pulmonary embolism: Secondary | ICD-10-CM | POA: Diagnosis not present

## 2014-08-09 DIAGNOSIS — E785 Hyperlipidemia, unspecified: Secondary | ICD-10-CM | POA: Diagnosis not present

## 2014-08-09 DIAGNOSIS — Z8673 Personal history of transient ischemic attack (TIA), and cerebral infarction without residual deficits: Secondary | ICD-10-CM | POA: Diagnosis not present

## 2014-08-09 DIAGNOSIS — Z862 Personal history of diseases of the blood and blood-forming organs and certain disorders involving the immune mechanism: Secondary | ICD-10-CM | POA: Insufficient documentation

## 2014-08-09 LAB — I-STAT CG4 LACTIC ACID, ED
LACTIC ACID, VENOUS: 2.88 mmol/L — AB (ref 0.5–2.2)
LACTIC ACID, VENOUS: 0.91 mmol/L (ref 0.5–2.2)

## 2014-08-09 LAB — CBC WITH DIFFERENTIAL/PLATELET
Basophils Absolute: 0 10*3/uL (ref 0.0–0.1)
Basophils Relative: 0 % (ref 0–1)
EOS PCT: 0 % (ref 0–5)
Eosinophils Absolute: 0 10*3/uL (ref 0.0–0.7)
HCT: 39.4 % (ref 39.0–52.0)
Hemoglobin: 13.7 g/dL (ref 13.0–17.0)
LYMPHS PCT: 8 % — AB (ref 12–46)
Lymphs Abs: 0.8 10*3/uL (ref 0.7–4.0)
MCH: 31.6 pg (ref 26.0–34.0)
MCHC: 34.8 g/dL (ref 30.0–36.0)
MCV: 90.8 fL (ref 78.0–100.0)
Monocytes Absolute: 0.3 10*3/uL (ref 0.1–1.0)
Monocytes Relative: 3 % (ref 3–12)
Neutro Abs: 9.7 10*3/uL — ABNORMAL HIGH (ref 1.7–7.7)
Neutrophils Relative %: 89 % — ABNORMAL HIGH (ref 43–77)
PLATELETS: 213 10*3/uL (ref 150–400)
RBC: 4.34 MIL/uL (ref 4.22–5.81)
RDW: 11.9 % (ref 11.5–15.5)
WBC: 10.8 10*3/uL — ABNORMAL HIGH (ref 4.0–10.5)

## 2014-08-09 LAB — COMPREHENSIVE METABOLIC PANEL
ALK PHOS: 62 U/L (ref 39–117)
ALT: 29 U/L (ref 0–53)
AST: 31 U/L (ref 0–37)
Albumin: 4.5 g/dL (ref 3.5–5.2)
Anion gap: 19 — ABNORMAL HIGH (ref 5–15)
BUN: 14 mg/dL (ref 6–23)
CO2: 21 meq/L (ref 19–32)
Calcium: 9.8 mg/dL (ref 8.4–10.5)
Chloride: 100 mEq/L (ref 96–112)
Creatinine, Ser: 0.8 mg/dL (ref 0.50–1.35)
GFR, EST NON AFRICAN AMERICAN: 83 mL/min — AB (ref >90)
GLUCOSE: 175 mg/dL — AB (ref 70–99)
POTASSIUM: 4 meq/L (ref 3.7–5.3)
SODIUM: 140 meq/L (ref 137–147)
Total Bilirubin: 0.9 mg/dL (ref 0.3–1.2)
Total Protein: 8.1 g/dL (ref 6.0–8.3)

## 2014-08-09 LAB — URINALYSIS, ROUTINE W REFLEX MICROSCOPIC
BILIRUBIN URINE: NEGATIVE
GLUCOSE, UA: NEGATIVE mg/dL
Hgb urine dipstick: NEGATIVE
Ketones, ur: 40 mg/dL — AB
Leukocytes, UA: NEGATIVE
Nitrite: NEGATIVE
PH: 8.5 — AB (ref 5.0–8.0)
Protein, ur: 30 mg/dL — AB
Specific Gravity, Urine: 1.016 (ref 1.005–1.030)
Urobilinogen, UA: 0.2 mg/dL (ref 0.0–1.0)

## 2014-08-09 LAB — URINE MICROSCOPIC-ADD ON

## 2014-08-09 LAB — TROPONIN I: Troponin I: <0.3 ng/mL (ref <0.30)

## 2014-08-09 LAB — LIPASE, BLOOD: Lipase: 45 U/L (ref 11–59)

## 2014-08-09 MED ORDER — ONDANSETRON HCL 4 MG/2ML IJ SOLN
4.0000 mg | Freq: Once | INTRAMUSCULAR | Status: AC
  Administered 2014-08-09: 4 mg via INTRAVENOUS

## 2014-08-09 MED ORDER — CEPHALEXIN 500 MG PO CAPS: 500.0000 mg | ORAL_CAPSULE | Freq: Two times a day (BID) | ORAL | Status: DC

## 2014-08-09 MED ORDER — ONDANSETRON 4 MG PO TBDP: ORAL_TABLET | ORAL | Status: DC

## 2014-08-09 MED ORDER — PROMETHAZINE HCL 25 MG/ML IJ SOLN
INTRAMUSCULAR | Status: AC
  Administered 2014-08-09: 12.5 mg via INTRAVENOUS
  Filled 2014-08-09: qty 1

## 2014-08-09 MED ORDER — ONDANSETRON HCL 4 MG/2ML IJ SOLN
INTRAMUSCULAR | Status: AC
  Filled 2014-08-09: qty 2

## 2014-08-09 MED ORDER — PROMETHAZINE HCL 25 MG/ML IJ SOLN
12.5000 mg | Freq: Once | INTRAMUSCULAR | Status: AC
  Administered 2014-08-09: 12.5 mg via INTRAVENOUS

## 2014-08-09 MED ORDER — SODIUM CHLORIDE 0.9 % IV BOLUS (SEPSIS)
1000.0000 mL | Freq: Once | INTRAVENOUS | Status: AC
  Administered 2014-08-09: 1000 mL via INTRAVENOUS

## 2014-08-09 NOTE — ED Notes (Signed)
Lactic Acid 2.88 Critical Value Reported to Dr. Regenia Skeeter.

## 2014-08-09 NOTE — ED Notes (Signed)
Family is in waiting area, visitor stickers given and delay explained

## 2014-08-09 NOTE — ED Notes (Signed)
Patient able to void, no I&O required

## 2014-08-09 NOTE — ED Notes (Signed)
Pt placed on heart monitor.

## 2014-08-09 NOTE — ED Notes (Signed)
Began vomiting this Am after church. 178/95, HR 64, RR 18, O2 99% CBG 120

## 2014-08-09 NOTE — ED Provider Notes (Signed)
CSN: [id number]     Arrival date & time 08/09/14  1511 History  This chart was scribed for Samuel Hamburger, MD by Cathie Hoops, ED Scribe. The patient was seen in [place]. The patient's care was started at 3:20 PM.     Chief Complaint  Patient presents with  . Emesis   The history is provided by the patient. No language interpreter was used.   HPI Comments: Samuel Meyers is a 80 y.o. Male who presents to the Emergency Department complaining vomiting that started just prior to arrival. Pt denies he is currently having chest pain or abdominal pain.  Per pt's family, pt started vomiting after his car ride home from church. Pt notes his dementia is not worse than normal. Per his family he had no new diarrhea or fevers. Per family, pt had coffee, banana and blueberry muffins this morning which is his normal diet. He has not had any other illness recently. No complaints of abdominal pain.  Per nurse pt with hx of baseline dementia. Pt lives in a nursing home.  Past Medical History  Diagnosis Date  . Hypertension   . Hyperlipidemia   . CAD (coronary artery disease)   . Pulmonary embolus     Time of CABG  . Dementia   . Depression   . Stroke     2013--Sept  . Gastroenteritis 10/20/2013  . Anemia 03/01/2014  . Prostate cancer    Past Surgical History  Procedure Laterality Date  . Coronary artery bypass graft      [place]  . External ear surgery    . Tonsillectomy    . Tee without cardioversion  08/15/2012    Procedure: TRANSESOPHAGEAL ECHOCARDIOGRAM (TEE);  Surgeon: Fay Records, MD;  Location: [facility] ENDOSCOPY;  Service: Cardiovascular;  Laterality: N/A;   Family History  Problem Relation Age of Onset  . Coronary artery disease Father     MI in his 52s   History  Substance Use Topics  . Smoking status: Never Smoker   . Smokeless tobacco: Never Used  . Alcohol Use: No    Review of Systems  Unable to perform ROS: Dementia  Cardiovascular: Negative for chest pain.   Gastrointestinal: Positive for nausea and vomiting. Negative for abdominal pain.      Allergies  Niacin and related and Wellbutrin  Home Medications   Prior to Admission medications   Medication Sig Start Date End Date Taking? Authorizing Provider  acetaminophen (TYLENOL) 650 MG CR tablet Take 1,300 mg by mouth daily.    Historical Provider, MD  amLODipine (NORVASC) 5 MG tablet TAKE 1 TABLET DAILY 06/18/14   Mosie Lukes, MD  atorvastatin (LIPITOR) 20 MG tablet Take 1 tablet (20 mg total) by mouth daily. 07/06/14   Mosie Lukes, MD  bimatoprost (LUMIGAN) 0.03 % ophthalmic solution Place 1 drop into both eyes at bedtime.    Historical Provider, MD  carvedilol (COREG) 6.25 MG tablet Take 6.25 mg by mouth daily.    Historical Provider, MD  cholecalciferol (VITAMIN D) 400 UNITS TABS Take 2,000 Units by mouth daily.     Historical Provider, MD  clopidogrel (PLAVIX) 75 MG tablet TAKE 1 TABLET DAILY WITH BREAKFAST 08/03/14   Mosie Lukes, MD  Coenzyme Q10 (CO Q-10) 100 MG CAPS Take 300 mg by mouth daily.    Historical Provider, MD  Cyanocobalamin (VITAMIN B-12 CR) 1500 MCG TBCR Take by mouth daily.    Historical Provider, MD  finasteride (PROSCAR) 5 MG  tablet Take 5 mg by mouth daily.    Historical Provider, MD  HEMOCYTE-F 324-1 MG TABS TAKE 1 TABLET DAILY    Mosie Lukes, MD  isosorbide mononitrate (IMDUR) 30 MG 24 hr tablet TAKE 1 TABLET DAILY 03/03/14   Lelon Perla, MD  lisinopril (PRINIVIL,ZESTRIL) 5 MG tablet Take 2 tablets (10 mg total) by mouth 2 (two) times daily. 04/23/14   Mosie Lukes, MD  Misc Natural Products (OSTEO BI-FLEX JOINT SHIELD PO) Take 2 tablets by mouth daily.     Historical Provider, MD  Multiple Vitamin (MULTIVITAMIN) capsule Take 1 capsule by mouth daily.    Historical Provider, MD  NAMENDA 10 MG tablet TAKE 1 TABLET TWICE A DAY 08/04/14   Mosie Lukes, MD  Omega-3 Fatty Acids (FP FISH OIL PO) Take 2,400 mg by mouth daily.     Historical Provider, MD   rivastigmine (EXELON) 9.5 mg/24hr Place 1 patch onto the skin daily.    Historical Provider, MD  timolol (BETIMOL) 0.25 % ophthalmic solution Place 1-2 drops into both eyes 2 (two) times daily.    Historical Provider, MD  vitamin C (ASCORBIC ACID) 500 MG tablet Take 500 mg by mouth daily.    Historical Provider, MD   Triage Vitals: BP 164/79  Pulse 66  Temp(Src) 96.7 F (35.9 C) (Rectal)  Resp 24  SpO2 100% Physical Exam  Nursing note and vitals reviewed. Constitutional: He appears well-developed and well-nourished. No distress.  HENT:  Head: Normocephalic and atraumatic.  Eyes: Pupils are equal, round, and reactive to light.  Neck: Neck supple. No tracheal deviation present.  Cardiovascular: Normal rate, regular rhythm and normal heart sounds.   Pulmonary/Chest: Effort normal. No respiratory distress.  Abdominal: Soft. He exhibits no distension. There is no tenderness.  Neurological: He is alert. He is disoriented.  Pt is confused and disoriented but at baseline per family.  Skin: Skin is warm and dry.  Psychiatric: He has a normal mood and affect. His behavior is normal.    ED Course  Procedures (including critical care time) DIAGNOSTIC STUDIES: Oxygen Saturation is 100% on RA, normal by my interpretation.    COORDINATION OF CARE: 3:36 PM- Will order Zofran, CXR, CBC, and UA. Patient informed of current plan for treatment and evaluation and agrees with plan at this time.    Labs Review Labs Reviewed  CBC WITH DIFFERENTIAL - Abnormal; Notable for the following:    WBC 10.8 (*)    Neutrophils Relative % 89 (*)    Neutro Abs 9.7 (*)    Lymphocytes Relative 8 (*)    All other components within normal limits  COMPREHENSIVE METABOLIC PANEL - Abnormal; Notable for the following:    Glucose, Bld 175 (*)    GFR calc non Af Amer 83 (*)    Anion gap 19 (*)    All other components within normal limits  URINALYSIS, ROUTINE W REFLEX MICROSCOPIC - Abnormal; Notable for the  following:    pH 8.5 (*)    Ketones, ur 40 (*)    Protein, ur 30 (*)    All other components within normal limits  URINE MICROSCOPIC-ADD ON - Abnormal; Notable for the following:    Bacteria, UA MANY (*)    All other components within normal limits  I-STAT CG4 LACTIC ACID, ED - Abnormal; Notable for the following:    Lactic Acid, Venous 2.88 (*)    All other components within normal limits  URINE CULTURE  LIPASE, BLOOD  TROPONIN I  I-STAT CG4 LACTIC ACID, ED    Imaging Review Dg Abd 1 View 08/09/2014   CLINICAL DATA:  Vomiting.  Prostate carcinoma.  EXAM: ABDOMEN - 1 VIEW  COMPARISON:  None.  FINDINGS: No evidence of dilated bowel loops. IVC filter is seen at the level of L3-4. Surgical clips also seen in the epigastric region. Multiple small calcified gallstones noted.  IMPRESSION: No acute findings.  Cholelithiasis.   Electronically Signed   By: Earle Gell M.D.   On: 08/09/2014 16:32   Dg Chest Portable 1 View 08/09/2014   CLINICAL DATA:  Vomiting.  EXAM: PORTABLE CHEST - 1 VIEW  COMPARISON:  None.  FINDINGS: The patient has had prior CABG. The heart size and mediastinal contours are within normal limits. Lung volumes are relatively low. There is no evidence of pulmonary edema, consolidation, pneumothorax, nodule or pleural fluid.  IMPRESSION: No active disease.   Electronically Signed   By: Aletta Edouard M.D.   On: 08/09/2014 16:04     CT Head Wo Contrast (Final result)  Result time: 08/09/14 16:35:31    Final result by Rad Results In Interface (08/09/14 16:35:31)    Narrative:   CLINICAL DATA: Severe vomiting. Hypertension. Dementia. Prostate carcinoma.  EXAM: CT HEAD WITHOUT CONTRAST  TECHNIQUE: Contiguous axial images were obtained from the base of the skull through the vertex without intravenous contrast.  COMPARISON: 08/13/2012  FINDINGS: There is no evidence of intracranial hemorrhage, brain edema, or other signs of acute infarction. There is no evidence  of intracranial mass lesion or mass effect. No abnormal extraaxial fluid collections are identified.  Mild diffuse cerebral atrophy and severe chronic small vessel disease are stable in appearance. Ventricles are stable in size. No evidence of skull fracture. Postop changes again seen from previous right mastoidectomy.  IMPRESSION: No acute intracranial abnormality.  Stable cerebral atrophy and severe chronic small vessel disease.     EKG Interpretation Date/Time:  Sunday August 09 2014 15:44:23 EDT Ventricular Rate:  61 PR Interval:  264 QRS Duration: 92 QT Interval:  472 QTC Calculation: 475 R Axis:   67 Text Interpretation:  Sinus rhythm with 1st degree A-V block Otherwise  normal ECG no significant change since April 2015 Confirmed by Regenia Skeeter   MD, Reita Shindler ([id number]) on 08/09/2014 3:44:40 PM      MDM   Final diagnoses:  Non-intractable vomiting with nausea, vomiting of unspecified type    Patient with vomiting of uncertain etiology. Per family at bedside he is otherwise in his usual state of health and is not confused and has not complained of abd or chest pain. Vomiting controlled and fluid resuscitated. He appeared improved after fluids. Mild lactate elevation to 2.8 that was significantly reduced after 1 bolus. No obvious cause for vomiting, and no distention or abd tenderness to suggest needing CT of abd/pelvis. Has bacteria without leukocytes in urine. Given patient is demented and thus poor historian, will cover for UTI with keflex as this could be a cause of vomiting. Stable for discharge.   I personally performed the services described in this documentation, which was scribed in my presence. The recorded information has been reviewed and is accurate.    Samuel Hamburger, MD 08/10/14 [phone]

## 2014-08-09 NOTE — Discharge Instructions (Signed)
Nausea and Vomiting °Nausea is a sick feeling that often comes before throwing up (vomiting). Vomiting is a reflex where stomach contents come out of your mouth. Vomiting can cause severe loss of body fluids (dehydration). Children and elderly adults can become dehydrated quickly, especially if they also have diarrhea. Nausea and vomiting are symptoms of a condition or disease. It is important to find the cause of your symptoms. °CAUSES  °· Direct irritation of the stomach lining. This irritation can result from increased acid production (gastroesophageal reflux disease), infection, food poisoning, taking certain medicines (such as nonsteroidal anti-inflammatory drugs), alcohol use, or tobacco use. °· Signals from the brain. These signals could be caused by a headache, heat exposure, an inner ear disturbance, increased pressure in the brain from injury, infection, a tumor, or a concussion, pain, emotional stimulus, or metabolic problems. °· An obstruction in the gastrointestinal tract (bowel obstruction). °· Illnesses such as diabetes, hepatitis, gallbladder problems, appendicitis, kidney problems, cancer, sepsis, atypical symptoms of a heart attack, or eating disorders. °· Medical treatments such as chemotherapy and radiation. °· Receiving medicine that makes you sleep (general anesthetic) during surgery. °DIAGNOSIS °Your caregiver may ask for tests to be done if the problems do not improve after a few days. Tests may also be done if symptoms are severe or if the reason for the nausea and vomiting is not clear. Tests may include: °· Urine tests. °· Blood tests. °· Stool tests. °· Cultures (to look for evidence of infection). °· X-rays or other imaging studies. °Test results can help your caregiver make decisions about treatment or the need for additional tests. °TREATMENT °You need to stay well hydrated. Drink frequently but in small amounts. You may wish to drink water, sports drinks, clear broth, or eat frozen  ice pops or gelatin dessert to help stay hydrated. When you eat, eating slowly may help prevent nausea. There are also some antinausea medicines that may help prevent nausea. °HOME CARE INSTRUCTIONS  °· Take all medicine as directed by your caregiver. °· If you do not have an appetite, do not force yourself to eat. However, you must continue to drink fluids. °· If you have an appetite, eat a normal diet unless your caregiver tells you differently. °¨ Eat a variety of complex carbohydrates (rice, wheat, potatoes, bread), lean meats, yogurt, fruits, and vegetables. °¨ Avoid high-fat foods because they are more difficult to digest. °· Drink enough water and fluids to keep your urine clear or pale yellow. °· If you are dehydrated, ask your caregiver for specific rehydration instructions. Signs of dehydration may include: °¨ Severe thirst. °¨ Dry lips and mouth. °¨ Dizziness. °¨ Dark urine. °¨ Decreasing urine frequency and amount. °¨ Confusion. °¨ Rapid breathing or pulse. °SEEK IMMEDIATE MEDICAL CARE IF:  °· You have blood or brown flecks (like coffee grounds) in your vomit. °· You have black or bloody stools. °· You have a severe headache or stiff neck. °· You are confused. °· You have severe abdominal pain. °· You have chest pain or trouble breathing. °· You do not urinate at least once every 8 hours. °· You develop cold or clammy skin. °· You continue to vomit for longer than 24 to 48 hours. °· You have a fever. °MAKE SURE YOU:  °· Understand these instructions. °· Will watch your condition. °· Will get help right away if you are not doing well or get worse. °Document Released: 11/20/2005 Document Revised: 02/12/2012 Document Reviewed: 04/19/2011 °ExitCare® Patient Information ©2015 [employer]. This information is not intended   to replace advice given to you by your health care provider. Make sure you discuss any questions you have with your health care provider. ° °Urinary Tract Infection °Urinary tract  infections (UTIs) can develop anywhere along your urinary tract. Your urinary tract is your body's drainage system for removing wastes and extra water. Your urinary tract includes two kidneys, two ureters, a bladder, and a urethra. Your kidneys are a pair of bean-shaped organs. Each kidney is about the size of your fist. They are located below your ribs, one on each side of your spine. °CAUSES °Infections are caused by microbes, which are microscopic organisms, including fungi, viruses, and bacteria. These organisms are so small that they can only be seen through a microscope. Bacteria are the microbes that most commonly cause UTIs. °SYMPTOMS  °Symptoms of UTIs may vary by age and gender of the patient and by the location of the infection. Symptoms in young women typically include a frequent and intense urge to urinate and a painful, burning feeling in the bladder or urethra during urination. Older women and men are more likely to be tired, shaky, and weak and have muscle aches and abdominal pain. A fever may mean the infection is in your kidneys. Other symptoms of a kidney infection include pain in your back or sides below the ribs, nausea, and vomiting. °DIAGNOSIS °To diagnose a UTI, your caregiver will ask you about your symptoms. Your caregiver also will ask to provide a urine sample. The urine sample will be tested for bacteria and white blood cells. White blood cells are made by your body to help fight infection. °TREATMENT  °Typically, UTIs can be treated with medication. Because most UTIs are caused by a bacterial infection, they usually can be treated with the use of antibiotics. The choice of antibiotic and length of treatment depend on your symptoms and the type of bacteria causing your infection. °HOME CARE INSTRUCTIONS °· If you were prescribed antibiotics, take them exactly as your caregiver instructs you. Finish the medication even if you feel better after you have only taken some of the  medication. °· Drink enough water and fluids to keep your urine clear or pale yellow. °· Avoid caffeine, tea, and carbonated beverages. They tend to irritate your bladder. °· Empty your bladder often. Avoid holding urine for long periods of time. °· Empty your bladder before and after sexual intercourse. °· After a bowel movement, women should cleanse from front to back. Use each tissue only once. °SEEK MEDICAL CARE IF:  °· You have back pain. °· You develop a fever. °· Your symptoms do not begin to resolve within 3 days. °SEEK IMMEDIATE MEDICAL CARE IF:  °· You have severe back pain or lower abdominal pain. °· You develop chills. °· You have nausea or vomiting. °· You have continued burning or discomfort with urination. °MAKE SURE YOU:  °· Understand these instructions. °· Will watch your condition. °· Will get help right away if you are not doing well or get worse. °Document Released: 08/30/2005 Document Revised: 05/21/2012 Document Reviewed: 12/29/2011 °ExitCare® Patient Information ©2015 [employer]. This information is not intended to replace advice given to you by your health care provider. Make sure you discuss any questions you have with your health care provider. ° °

## 2014-08-10 LAB — URINE CULTURE
Colony Count: NO GROWTH
Culture: NO GROWTH

## 2014-08-11 ENCOUNTER — Encounter: Payer: Self-pay | Admitting: Medical

## 2014-08-11 ENCOUNTER — Other Ambulatory Visit (HOSPITAL_BASED_OUTPATIENT_CLINIC_OR_DEPARTMENT_OTHER): Payer: Medicare Other

## 2014-08-11 ENCOUNTER — Ambulatory Visit (INDEPENDENT_AMBULATORY_CARE_PROVIDER_SITE_OTHER): Payer: Medicare Other | Admitting: Medical

## 2014-08-11 VITALS — BP 142/80 | HR 46 | Ht 70.0 in | Wt 181.8 lb

## 2014-08-11 DIAGNOSIS — R7309 Other abnormal glucose: Secondary | ICD-10-CM

## 2014-08-11 DIAGNOSIS — R739 Hyperglycemia, unspecified: Secondary | ICD-10-CM

## 2014-08-11 DIAGNOSIS — R1115 Cyclical vomiting syndrome unrelated to migraine: Secondary | ICD-10-CM

## 2014-08-11 DIAGNOSIS — K8020 Calculus of gallbladder without cholecystitis without obstruction: Secondary | ICD-10-CM

## 2014-08-11 DIAGNOSIS — R112 Nausea with vomiting, unspecified: Secondary | ICD-10-CM | POA: Insufficient documentation

## 2014-08-11 DIAGNOSIS — I25810 Atherosclerosis of coronary artery bypass graft(s) without angina pectoris: Secondary | ICD-10-CM

## 2014-08-11 LAB — HEMOGLOBIN A1C: Hgb A1c MFr Bld: 5.9 % (ref 4.6–6.5)

## 2014-08-11 NOTE — Progress Notes (Signed)
   Subjective:    Patient ID: Samuel Meyers, male    DOB: Sep 25, 1934, 80 y.o.   MRN: [id number]  HPI   Pt in states he feels a lot better. He states Sunday afternoon he started with severe vomiting. He had coffee, muffin that and banana. Later in day after church he started to vomit. He states vomiting later constant. Maybe more than 10 times. He went to the emergency dept . He passed out at home after vomiting episode. Pt was taken to ED by EMS. Pt had work up at ED by EMS. Labs and imaging studies. All test were negative. Pt was hydrated and given zofran. No significant lab abnormalities.   The next day after on Monday am had very large watery stools. None since.  Pt on on xray showed some multiple small gallstones.   Pt states today feels great. A lot of energy. Last time he vomited was Sunday at the ED.    Review of Systems  Constitutional: Negative for fever, chills and fatigue.  HENT: Negative.   Respiratory: Negative for choking, shortness of breath and wheezing.   Cardiovascular: Negative for chest pain and palpitations.  Gastrointestinal: Negative for vomiting, abdominal pain, diarrhea, blood in stool and abdominal distention.       1 large loose stool the day following emergency department evaluation. No diarrhea since. He denies ever having any abdominal pain when he was vomiting.  Genitourinary: Negative.   Musculoskeletal: Negative for back pain, myalgias and neck stiffness.  Skin: Negative.   Neurological: Negative.   Hematological: Negative for adenopathy. Does not bruise/bleed easily.  Psychiatric/Behavioral: Negative for suicidal ideas, behavioral problems, sleep disturbance, dysphoric mood and decreased concentration. The patient is not nervous/anxious.        He does not exhibit any obvious dementia today. But what times he seems to not comprehend part of what I'm saying. Then his family members help clarify. Also I question whether or not he years he hears me  clearly.       Objective:   Physical Exam  Constitutional: He is oriented to person, place, and time. He appears well-developed and well-nourished. No distress.  Patient is in a very good mood and looks very energetic. He states he feels great and is a very good mood.  HENT:  Head: Normocephalic and atraumatic.  Eyes: Conjunctivae are normal. Pupils are equal, round, and reactive to light.  Neck: Neck supple. No JVD present. No tracheal deviation present. No thyromegaly present.  Cardiovascular: Normal rate, regular rhythm and normal heart sounds.  Exam reveals no gallop and no friction rub.   No murmur heard. Pulmonary/Chest: Effort normal and breath sounds normal. No stridor. He has no wheezes. He has no rales. He exhibits no tenderness.  Abdominal: He exhibits no mass. There is no tenderness. There is no rebound.  Musculoskeletal: He exhibits no edema.  Of lower extremities  Lymphadenopathy:    He has no cervical adenopathy.  Neurological: He is alert and oriented to person, place, and time.  Cranial nerves III through XII grossly intact. EOMs intact.  Skin: He is not diaphoretic.  Skin feels moist.  Psychiatric: He has a normal mood and affect. His behavior is normal. Thought content normal.          Assessment & Plan:

## 2014-08-11 NOTE — Assessment & Plan Note (Signed)
Will get a A1c today and evaluate the last 3 months.

## 2014-08-11 NOTE — Assessment & Plan Note (Addendum)
This was eventually controlled in the emergency department with Zofran. At the time he was evaluated UTI was thought to be the caus.  But the culture today which list final reports no growth. So I advised him to stop his antibiotic and if he has any recurrent signs or symptoms as before.  No his illness may have been viral in nature based on some mild findings on the CBC. Also he did recover quite rapidly and was much improved by Monday and states that he feels great today.

## 2014-08-11 NOTE — Assessment & Plan Note (Signed)
This was found on plain x-ray in the emergency department. Since years no clear reason why he was vomiting so aggressively I do want to get an ultrasound and assess the gallbladder wall to see if it is inflamed. If he gets right upper quadrant pain in the future this may be helpful in determining management.

## 2014-08-11 NOTE — Patient Instructions (Signed)
I have reviewed your labs and imaging studies. Your urine culture final result came back negative. So you can stop the antibiotic. Your bs was elevated so I want to get a1-c today. Your xray the other day at the emergency dept showed gallstones. I want to get abdominal ultrasound to assess gallbladder more in detail. You feel great today but if your symptoms change or worsen  let me know/return. After reviewing all your labs and how quickly you improved it is possible you had transient viral illness.

## 2014-08-13 ENCOUNTER — Ambulatory Visit (HOSPITAL_BASED_OUTPATIENT_CLINIC_OR_DEPARTMENT_OTHER)
Admission: RE | Admit: 2014-08-13 | Discharge: 2014-08-13 | Disposition: A | Discharge status: Home / Self Care | Payer: Medicare Other | Source: Ambulatory Visit | Attending: Medical | Admitting: Medical

## 2014-08-13 DIAGNOSIS — K8020 Calculus of gallbladder without cholecystitis without obstruction: Secondary | ICD-10-CM | POA: Diagnosis not present

## 2014-08-13 DIAGNOSIS — Q619 Cystic kidney disease, unspecified: Secondary | ICD-10-CM | POA: Diagnosis not present

## 2014-08-27 ENCOUNTER — Ambulatory Visit (INDEPENDENT_AMBULATORY_CARE_PROVIDER_SITE_OTHER): Payer: Medicare Other | Admitting: Family Medicine

## 2014-08-27 ENCOUNTER — Encounter: Payer: Self-pay | Admitting: Family Medicine

## 2014-08-27 VITALS — BP 134/65 | HR 48 | Temp 97.8°F | Ht 70.0 in | Wt 178.8 lb

## 2014-08-27 DIAGNOSIS — R112 Nausea with vomiting, unspecified: Secondary | ICD-10-CM

## 2014-08-27 DIAGNOSIS — Q619 Cystic kidney disease, unspecified: Secondary | ICD-10-CM

## 2014-08-27 DIAGNOSIS — F0391 Unspecified dementia with behavioral disturbance: Secondary | ICD-10-CM

## 2014-08-27 DIAGNOSIS — F03918 Unspecified dementia, unspecified severity, with other behavioral disturbance: Secondary | ICD-10-CM

## 2014-08-27 DIAGNOSIS — Z23 Encounter for immunization: Secondary | ICD-10-CM

## 2014-08-27 DIAGNOSIS — E785 Hyperlipidemia, unspecified: Secondary | ICD-10-CM

## 2014-08-27 DIAGNOSIS — N281 Cyst of kidney, acquired: Secondary | ICD-10-CM

## 2014-08-27 DIAGNOSIS — R7309 Other abnormal glucose: Secondary | ICD-10-CM

## 2014-08-27 DIAGNOSIS — I25810 Atherosclerosis of coronary artery bypass graft(s) without angina pectoris: Secondary | ICD-10-CM

## 2014-08-27 DIAGNOSIS — I10 Essential (primary) hypertension: Secondary | ICD-10-CM

## 2014-08-27 DIAGNOSIS — R739 Hyperglycemia, unspecified: Secondary | ICD-10-CM

## 2014-08-27 HISTORY — DX: Cyst of kidney, acquired: N28.1

## 2014-08-27 NOTE — Assessment & Plan Note (Signed)
Well controlled, no changes to meds. Encouraged heart healthy diet such as the DASH diet and exercise as tolerated.  °

## 2014-08-27 NOTE — Assessment & Plan Note (Signed)
Tolerating statin, encouraged heart healthy diet, avoid trans fats, minimize simple carbs and saturated fats. Increase exercise as tolerated 

## 2014-08-27 NOTE — Assessment & Plan Note (Signed)
Resolved quickly after some diarrhea. Likely viral but ultrasound did confirm some small gallstones so if symptoms return may need to consider referral

## 2014-08-27 NOTE — Progress Notes (Signed)
Pre visit review using our clinic review tool, if applicable. No additional management support is needed unless otherwise documented below in the visit note. 

## 2014-08-27 NOTE — Assessment & Plan Note (Signed)
Mild asymptomatic bradycardia. Agrees to follow upw ith his cardiologist soon

## 2014-08-27 NOTE — Assessment & Plan Note (Signed)
hgba1c acceptable, minimize simple carbs. Increase exercise as tolerated.  

## 2014-08-27 NOTE — Assessment & Plan Note (Signed)
Benign appearance

## 2014-08-27 NOTE — Patient Instructions (Signed)

## 2014-08-27 NOTE — Progress Notes (Signed)
Patient ID: Samuel Meyers, male   DOB: 1934-11-17, 80 y.o.   MRN: [id number] Samuel Meyers [id number] 19-Mar-1934 08/27/2014      Progress Note-Follow Up  Subjective  Chief Complaint  Chief Complaint  Patient presents with  . Follow-up    3 month  . Injections    flu    HPI  Patient is a 80 year old male in today for routine medical care. Here today with his wife and daughter for followup. Doing fairly well. Did have a recent visit to the emergency room with nausea vomiting and ultimately diarrhea. Those symptoms have resolved. Gallbladder did show some small but nonobstructing stones without any other concerning lesions. Cyst on the right kidney was also noted but was thought to be benign. He feels well today. No abdominal pain. No other acute complaints Denies CP/palp/SOB/HA/congestion/fevers/GI or GU c/o. Taking meds as prescribed  Past Medical History  Diagnosis Date  . Hypertension   . Hyperlipidemia   . CAD (coronary artery disease)   . Pulmonary embolus     Time of CABG  . Dementia   . Depression   . Stroke     2013--Sept  . Gastroenteritis 10/20/2013  . Anemia 03/01/2014  . Prostate cancer     Past Surgical History  Procedure Laterality Date  . Coronary artery bypass graft      [place]  . External ear surgery    . Tonsillectomy    . Tee without cardioversion  08/15/2012    Procedure: TRANSESOPHAGEAL ECHOCARDIOGRAM (TEE);  Surgeon: Fay Records, MD;  Location: [facility] ENDOSCOPY;  Service: Cardiovascular;  Laterality: N/A;    Family History  Problem Relation Age of Onset  . Coronary artery disease Father     MI in his 12s    History   Social History  . Marital Status: Married    Spouse Name: N/A    Number of Children: 1  . Years of Education: N/A   Occupational History  . security     Retired   Social History Main Topics  . Smoking status: Never Smoker   . Smokeless tobacco: Never Used  . Alcohol Use: No  . Drug Use: No  . Sexual Activity:  Not on file   Other Topics Concern  . Not on file   Social History Narrative  . No narrative on file    Current Outpatient Prescriptions on File Prior to Visit  Medication Sig Dispense Refill  . acetaminophen (TYLENOL) 650 MG CR tablet Take 1,300 mg by mouth daily.      Marland Kitchen amLODipine (NORVASC) 5 MG tablet TAKE 1 TABLET DAILY  90 tablet  0  . atorvastatin (LIPITOR) 20 MG tablet Take 1 tablet (20 mg total) by mouth daily.  90 tablet  0  . bimatoprost (LUMIGAN) 0.03 % ophthalmic solution Place 1 drop into both eyes at bedtime.      . carvedilol (COREG) 6.25 MG tablet Take 6.25 mg by mouth daily.      . cholecalciferol (VITAMIN D) 400 UNITS TABS Take 2,000 Units by mouth daily.       . clopidogrel (PLAVIX) 75 MG tablet TAKE 1 TABLET DAILY WITH BREAKFAST  90 tablet  0  . Coenzyme Q10 (CO Q-10) 100 MG CAPS Take 300 mg by mouth daily.      . Cyanocobalamin (VITAMIN B-12 CR) 1500 MCG TBCR Take by mouth daily.      . finasteride (PROSCAR) 5 MG tablet Take 5 mg by mouth  daily.      Marland Kitchen HEMOCYTE-F 324-1 MG TABS TAKE 1 TABLET DAILY  90 each  1  . isosorbide mononitrate (IMDUR) 30 MG 24 hr tablet TAKE 1 TABLET DAILY  90 tablet  2  . lisinopril (PRINIVIL,ZESTRIL) 5 MG tablet Take 2 tablets (10 mg total) by mouth 2 (two) times daily.  360 tablet  1  . Misc Natural Products (OSTEO BI-FLEX JOINT SHIELD PO) Take 2 tablets by mouth daily.       . Multiple Vitamin (MULTIVITAMIN) capsule Take 1 capsule by mouth daily.      Marland Kitchen NAMENDA 10 MG tablet TAKE 1 TABLET TWICE A DAY  180 tablet  0  . Omega-3 Fatty Acids (FP FISH OIL PO) Take 2,400 mg by mouth daily.       . ondansetron (ZOFRAN ODT) 4 MG disintegrating tablet 4mg  ODT q4 hours prn nausea/vomit  10 tablet  0  . rivastigmine (EXELON) 9.5 mg/24hr Place 1 patch onto the skin daily.      . timolol (BETIMOL) 0.25 % ophthalmic solution Place 1-2 drops into both eyes 2 (two) times daily.      . vitamin C (ASCORBIC ACID) 500 MG tablet Take 500 mg by mouth daily.        No current facility-administered medications on file prior to visit.    Allergies  Allergen Reactions  . Niacin And Related Nausea And Vomiting  . Wellbutrin [Bupropion] Hives    Gi upset    Review of Systems  Review of Systems  Constitutional: Negative for fever and malaise/fatigue.  HENT: Negative for congestion.   Eyes: Negative for discharge.  Respiratory: Negative for shortness of breath.   Cardiovascular: Negative for chest pain, palpitations and leg swelling.  Gastrointestinal: Negative for nausea, abdominal pain and diarrhea.  Genitourinary: Negative for dysuria.  Musculoskeletal: Negative for falls.  Skin: Negative for rash.  Neurological: Negative for loss of consciousness and headaches.  Endo/Heme/Allergies: Negative for polydipsia.  Psychiatric/Behavioral: Positive for memory loss. Negative for depression and suicidal ideas. The patient is not nervous/anxious and does not have insomnia.     Objective  BP 134/65  Pulse 48  Temp(Src) 97.8 F (36.6 C) (Oral)  Ht 5\' 10"  (1.778 m)  Wt 178 lb 12.8 oz (81.103 kg)  BMI 25.66 kg/m2  SpO2 100%  Physical Exam  Physical Exam  Constitutional: He is oriented to person, place, and time and well-developed, well-nourished, and in no distress. No distress.  HENT:  Head: Normocephalic and atraumatic.  Eyes: Conjunctivae are normal.  Neck: Neck supple. No thyromegaly present.  Cardiovascular: Regular rhythm and normal heart sounds.   bradycardia  Pulmonary/Chest: Effort normal and breath sounds normal. No respiratory distress.  Abdominal: He exhibits no distension and no mass. There is no tenderness.  Musculoskeletal: He exhibits no edema.  Neurological: He is alert and oriented to person, place, and time.  Skin: Skin is warm.  Psychiatric: Memory, affect and judgment normal.    Lab Results  Component Value Date   TSH 0.840 02/26/2014   Lab Results  Component Value Date   WBC 10.8* 08/09/2014   HGB 13.7  08/09/2014   HCT 39.4 08/09/2014   MCV 90.8 08/09/2014   PLT 213 08/09/2014   Lab Results  Component Value Date   CREATININE 0.80 08/09/2014   BUN 14 08/09/2014   NA 140 08/09/2014   K 4.0 08/09/2014   CL 100 08/09/2014   CO2 21 08/09/2014   Lab Results  Component Value Date  ALT 29 08/09/2014   AST 31 08/09/2014   ALKPHOS 62 08/09/2014   BILITOT 0.9 08/09/2014   Lab Results  Component Value Date   CHOL 95 02/26/2014   Lab Results  Component Value Date   HDL 42 02/26/2014   Lab Results  Component Value Date   LDLCALC 40 02/26/2014   Lab Results  Component Value Date   TRIG 63 02/26/2014   Lab Results  Component Value Date   CHOLHDL 2.3 02/26/2014     Assessment & Plan  Hypertension Well controlled, no changes to meds. Encouraged heart healthy diet such as the DASH diet and exercise as tolerated.   Nausea with vomiting Resolved quickly after some diarrhea. Likely viral but ultrasound did confirm some small gallstones so if symptoms return may need to consider referral  CAD (coronary artery disease) of artery bypass graft Mild asymptomatic bradycardia. Agrees to follow upw ith his cardiologist soon  Hyperlipidemia Tolerating statin, encouraged heart healthy diet, avoid trans fats, minimize simple carbs and saturated fats. Increase exercise as tolerated  Hyperglycemia [id number] acceptable, minimize simple carbs. Increase exercise as tolerated.   Dementia Wife and daughter confirm he is doing fairly well at this time. No changes

## 2014-08-27 NOTE — Assessment & Plan Note (Signed)
Wife and daughter confirm he is doing fairly well at this time. No changes

## 2014-08-29 ENCOUNTER — Other Ambulatory Visit: Payer: Self-pay | Admitting: Family Medicine

## 2014-09-13 ENCOUNTER — Other Ambulatory Visit: Payer: Self-pay | Admitting: Family Medicine

## 2014-09-16 ENCOUNTER — Other Ambulatory Visit: Payer: Self-pay | Admitting: Family Medicine

## 2014-10-14 ENCOUNTER — Other Ambulatory Visit: Payer: Self-pay | Admitting: Family Medicine

## 2014-10-15 ENCOUNTER — Encounter (HOSPITAL_BASED_OUTPATIENT_CLINIC_OR_DEPARTMENT_OTHER): Payer: Self-pay | Admitting: *Deleted

## 2014-10-15 ENCOUNTER — Emergency Department (HOSPITAL_BASED_OUTPATIENT_CLINIC_OR_DEPARTMENT_OTHER): Payer: Medicare Other

## 2014-10-15 ENCOUNTER — Emergency Department (HOSPITAL_BASED_OUTPATIENT_CLINIC_OR_DEPARTMENT_OTHER)
Admission: EM | Admit: 2014-10-15 | Discharge: 2014-10-15 | Disposition: A | Discharge status: Home / Self Care | Payer: Medicare Other | Attending: Emergency Medicine | Admitting: Emergency Medicine

## 2014-10-15 DIAGNOSIS — E785 Hyperlipidemia, unspecified: Secondary | ICD-10-CM | POA: Insufficient documentation

## 2014-10-15 DIAGNOSIS — Z862 Personal history of diseases of the blood and blood-forming organs and certain disorders involving the immune mechanism: Secondary | ICD-10-CM | POA: Insufficient documentation

## 2014-10-15 DIAGNOSIS — Z951 Presence of aortocoronary bypass graft: Secondary | ICD-10-CM | POA: Insufficient documentation

## 2014-10-15 DIAGNOSIS — Q6100 Congenital renal cyst, unspecified: Secondary | ICD-10-CM | POA: Insufficient documentation

## 2014-10-15 DIAGNOSIS — Z86711 Personal history of pulmonary embolism: Secondary | ICD-10-CM | POA: Insufficient documentation

## 2014-10-15 DIAGNOSIS — R251 Tremor, unspecified: Secondary | ICD-10-CM | POA: Insufficient documentation

## 2014-10-15 DIAGNOSIS — F329 Major depressive disorder, single episode, unspecified: Secondary | ICD-10-CM | POA: Insufficient documentation

## 2014-10-15 DIAGNOSIS — Z8546 Personal history of malignant neoplasm of prostate: Secondary | ICD-10-CM | POA: Insufficient documentation

## 2014-10-15 DIAGNOSIS — R112 Nausea with vomiting, unspecified: Secondary | ICD-10-CM | POA: Diagnosis present

## 2014-10-15 DIAGNOSIS — Z8719 Personal history of other diseases of the digestive system: Secondary | ICD-10-CM | POA: Diagnosis not present

## 2014-10-15 DIAGNOSIS — Z7902 Long term (current) use of antithrombotics/antiplatelets: Secondary | ICD-10-CM | POA: Insufficient documentation

## 2014-10-15 DIAGNOSIS — Z79899 Other long term (current) drug therapy: Secondary | ICD-10-CM | POA: Insufficient documentation

## 2014-10-15 DIAGNOSIS — I10 Essential (primary) hypertension: Secondary | ICD-10-CM | POA: Diagnosis not present

## 2014-10-15 DIAGNOSIS — I2510 Atherosclerotic heart disease of native coronary artery without angina pectoris: Secondary | ICD-10-CM | POA: Diagnosis not present

## 2014-10-15 DIAGNOSIS — F0390 Unspecified dementia without behavioral disturbance: Secondary | ICD-10-CM | POA: Insufficient documentation

## 2014-10-15 DIAGNOSIS — Z8673 Personal history of transient ischemic attack (TIA), and cerebral infarction without residual deficits: Secondary | ICD-10-CM | POA: Insufficient documentation

## 2014-10-15 DIAGNOSIS — R410 Disorientation, unspecified: Secondary | ICD-10-CM

## 2014-10-15 LAB — CBC WITH DIFFERENTIAL/PLATELET
BASOS PCT: 1 % (ref 0–1)
Basophils Absolute: 0 10*3/uL (ref 0.0–0.1)
EOS ABS: 0.2 10*3/uL (ref 0.0–0.7)
Eosinophils Relative: 2 % (ref 0–5)
HCT: 43.2 % (ref 39.0–52.0)
HEMOGLOBIN: 14.9 g/dL (ref 13.0–17.0)
Lymphocytes Relative: 23 % (ref 12–46)
Lymphs Abs: 1.9 10*3/uL (ref 0.7–4.0)
MCH: 31 pg (ref 26.0–34.0)
MCHC: 34.5 g/dL (ref 30.0–36.0)
MCV: 89.8 fL (ref 78.0–100.0)
MONOS PCT: 9 % (ref 3–12)
Monocytes Absolute: 0.7 10*3/uL (ref 0.1–1.0)
NEUTROS ABS: 5.5 10*3/uL (ref 1.7–7.7)
NEUTROS PCT: 65 % (ref 43–77)
PLATELETS: 238 10*3/uL (ref 150–400)
RBC: 4.81 MIL/uL (ref 4.22–5.81)
RDW: 12 % (ref 11.5–15.5)
WBC: 8.3 10*3/uL (ref 4.0–10.5)

## 2014-10-15 LAB — COMPREHENSIVE METABOLIC PANEL
ALBUMIN: 4.5 g/dL (ref 3.5–5.2)
ALK PHOS: 77 U/L (ref 39–117)
ALT: 34 U/L (ref 0–53)
ANION GAP: 14 (ref 5–15)
AST: 35 U/L (ref 0–37)
BUN: 11 mg/dL (ref 6–23)
CO2: 26 mEq/L (ref 19–32)
Calcium: 10.1 mg/dL (ref 8.4–10.5)
Chloride: 101 mEq/L (ref 96–112)
Creatinine, Ser: 0.9 mg/dL (ref 0.50–1.35)
GFR calc Af Amer: >90 mL/min (ref >90)
GFR calc non Af Amer: 79 mL/min — ABNORMAL LOW (ref >90)
Glucose, Bld: 117 mg/dL — ABNORMAL HIGH (ref 70–99)
POTASSIUM: 4.1 meq/L (ref 3.7–5.3)
Sodium: 141 mEq/L (ref 137–147)
TOTAL PROTEIN: 8.1 g/dL (ref 6.0–8.3)
Total Bilirubin: 0.8 mg/dL (ref 0.3–1.2)

## 2014-10-15 LAB — URINALYSIS, ROUTINE W REFLEX MICROSCOPIC
Bilirubin Urine: NEGATIVE
GLUCOSE, UA: NEGATIVE mg/dL
Hgb urine dipstick: NEGATIVE
Ketones, ur: 15 mg/dL — AB
LEUKOCYTES UA: NEGATIVE
Nitrite: NEGATIVE
PH: 8 (ref 5.0–8.0)
Protein, ur: NEGATIVE mg/dL
SPECIFIC GRAVITY, URINE: 1.015 (ref 1.005–1.030)
Urobilinogen, UA: 0.2 mg/dL (ref 0.0–1.0)

## 2014-10-15 LAB — TROPONIN I: Troponin I: <0.3 ng/mL (ref <0.30)

## 2014-10-15 LAB — LIPASE, BLOOD: LIPASE: 65 U/L — AB (ref 11–59)

## 2014-10-15 MED ORDER — ONDANSETRON HCL 4 MG/2ML IJ SOLN
4.0000 mg | Freq: Once | INTRAMUSCULAR | Status: AC
  Administered 2014-10-15: 4 mg via INTRAVENOUS
  Filled 2014-10-15: qty 2

## 2014-10-15 MED ORDER — SODIUM CHLORIDE 0.9 % IV SOLN
1000.0000 mL | Freq: Once | INTRAVENOUS | Status: AC
  Administered 2014-10-15: 1000 mL via INTRAVENOUS

## 2014-10-15 MED ORDER — SODIUM CHLORIDE 0.9 % IV SOLN: 1000.0000 mL | INTRAVENOUS | Status: DC

## 2014-10-15 MED ORDER — PROMETHAZINE HCL 25 MG/ML IJ SOLN
12.5000 mg | Freq: Once | INTRAMUSCULAR | Status: AC
Start: 2014-10-15 — End: 2014-10-15
  Administered 2014-10-15: 12.5 mg via INTRAVENOUS
  Filled 2014-10-15: qty 1

## 2014-10-15 NOTE — ED Notes (Signed)
Pt still in radiology at this time.

## 2014-10-15 NOTE — ED Notes (Signed)
Patient tolerated fluids well 

## 2014-10-15 NOTE — ED Notes (Signed)
EMS sts that pt was found in his bathroom by his wife vomiting. She told them that after he vomited, he became very pale and would not respond to her. EMS reports that when they arrived, Pt was pink, warm and dry and CAO per his normal. They state that the pt has been without complaint since they arrived. EMS ekg shows 1st degree av block.

## 2014-10-15 NOTE — ED Provider Notes (Signed)
CSN: [id number]     Arrival date & time 10/15/14  1646 History   First MD Initiated Contact with Patient 10/15/14 1658     Chief Complaint  Patient presents with  . Emesis    HPI The patient presents to the emergency room with complaints of nausea and vomiting that started this afternoon. Associated with these symptoms he's had some confusion. Patient has a history of dementia. At baseline he has some history of difficulty with his speech from a prior stroke and some issues with his memory as well as some occasional issues with paranoia but is able to carry on his ADLs.   This morning patient was fine. This afternoon he suddenly started vomiting. Patient denied any trouble within the chest pain or abdominal pain. He did complain of some headache. His wife is noticed that he is not as responsive as usual. He did have a similar episode a couple months ago and they were not able to determine the cause of the vomiting. This evaluation did include an abdominal ultrasound. The symptoms all eventually resolved and he has been fine for the last couple of months until today. Past Medical History  Diagnosis Date  . Hypertension   . Hyperlipidemia   . CAD (coronary artery disease)   . Pulmonary embolus     Time of CABG  . Dementia   . Depression   . Stroke     2013--Sept  . Gastroenteritis 10/20/2013  . Anemia 03/01/2014  . Prostate cancer   . Renal cyst, left 08/27/2014    5x6 cm on Ultrasound September 2015   Past Surgical History  Procedure Laterality Date  . Coronary artery bypass graft      [place]  . External ear surgery    . Tonsillectomy    . Tee without cardioversion  08/15/2012    Procedure: TRANSESOPHAGEAL ECHOCARDIOGRAM (TEE);  Surgeon: Fay Records, MD;  Location[facility] ENDOSCOPY;  Service: Cardiovascular;  Laterality: N/A;   Family History  Problem Relation Age of Onset  . Coronary artery disease Father     MI in his 91s   History  Substance Use Topics  . Smoking  status: Never Smoker   . Smokeless tobacco: Never Used  . Alcohol Use: No    Review of Systems  Constitutional: Negative for fever.  Respiratory: Negative for shortness of breath.   Cardiovascular: Negative for chest pain.  Genitourinary: Negative for dysuria.  Neurological: Positive for tremors. Negative for dizziness and weakness.  All other systems reviewed and are negative.     Allergies  Niacin and related and Wellbutrin  Home Medications   Prior to Admission medications   Medication Sig Start Date End Date Taking? Authorizing Provider  acetaminophen (TYLENOL) 650 MG CR tablet Take 1,300 mg by mouth daily.    Historical Provider, MD  amLODipine (NORVASC) 5 MG tablet TAKE 1 TABLET DAILY 08/31/14   Mosie Lukes, MD  atorvastatin (LIPITOR) 20 MG tablet TAKE 1 TABLET DAILY 09/16/14   Mosie Lukes, MD  bimatoprost (LUMIGAN) 0.03 % ophthalmic solution Place 1 drop into both eyes at bedtime.    Historical Provider, MD  carvedilol (COREG) 6.25 MG tablet Take 6.25 mg by mouth daily.    Historical Provider, MD  cholecalciferol (VITAMIN D) 400 UNITS TABS Take 2,000 Units by mouth daily.     Historical Provider, MD  clopidogrel (PLAVIX) 75 MG tablet TAKE 1 TABLET DAILY WITH BREAKFAST 10/15/14   Mosie Lukes, MD  Coenzyme  Q10 (CO Q-10) 100 MG CAPS Take 300 mg by mouth daily.    Historical Provider, MD  Cyanocobalamin (VITAMIN B-12 CR) 1500 MCG TBCR Take by mouth daily.    Historical Provider, MD  finasteride (PROSCAR) 5 MG tablet Take 5 mg by mouth daily.    Historical Provider, MD  HEMOCYTE-F 324-1 MG TABS TAKE 1 TABLET DAILY    Mosie Lukes, MD  isosorbide mononitrate (IMDUR) 30 MG 24 hr tablet TAKE 1 TABLET DAILY 03/03/14   Lelon Perla, MD  lisinopril (PRINIVIL,ZESTRIL) 5 MG tablet TAKE 2 TABLETS TWICE A DAY 09/14/14   Mosie Lukes, MD  Misc Natural Products (OSTEO BI-FLEX JOINT SHIELD PO) Take 2 tablets by mouth daily.     Historical Provider, MD  Multiple Vitamin  (MULTIVITAMIN) capsule Take 1 capsule by mouth daily.    Historical Provider, MD  NAMENDA 10 MG tablet TAKE 1 TABLET TWICE A DAY 08/04/14   Mosie Lukes, MD  Omega-3 Fatty Acids (FP FISH OIL PO) Take 2,400 mg by mouth daily.     Historical Provider, MD  ondansetron (ZOFRAN ODT) 4 MG disintegrating tablet 4mg  ODT q4 hours prn nausea/vomit 08/09/14   Ephraim Hamburger, MD  rivastigmine (EXELON) 9.5 mg/24hr Place 1 patch onto the skin daily.    Historical Provider, MD  timolol (BETIMOL) 0.25 % ophthalmic solution Place 1-2 drops into both eyes 2 (two) times daily.    Historical Provider, MD  vitamin C (ASCORBIC ACID) 500 MG tablet Take 500 mg by mouth daily.    Historical Provider, MD   BP 155/63 mmHg  Pulse 53  Temp(Src) 98.7 F (37.1 C) (Oral)  Resp 22  Wt 180 lb (81.647 kg)  SpO2 100% Physical Exam  Constitutional: He appears distressed.  HENT:  Head: Normocephalic and atraumatic.  Right Ear: External ear normal.  Left Ear: External ear normal.  Mouth/Throat: No oropharyngeal exudate.  Eyes: Conjunctivae are normal. Right eye exhibits no discharge. Left eye exhibits no discharge. No scleral icterus.  Neck: Neck supple. No tracheal deviation present.  Cardiovascular: Normal rate, regular rhythm and intact distal pulses.   Pulmonary/Chest: Effort normal and breath sounds normal. No stridor. No respiratory distress. He has no wheezes. He has no rales.  Abdominal: Soft. Bowel sounds are normal. He exhibits no distension. There is no tenderness. There is no rebound and no guarding.  Musculoskeletal: He exhibits no edema or tenderness.  Neurological: He is alert. He displays tremor. No cranial nerve deficit (no facial droop, extraocular movements intact, no slurred speech) or sensory deficit. He exhibits normal muscle tone. He displays no seizure activity. Coordination normal. GCS eye subscore is 4. GCS verbal subscore is 4. GCS motor subscore is 6.  Able to squeeze both hands, move both feet, no  facial droop,   Skin: Skin is warm and dry. No rash noted. He is not diaphoretic.  Psychiatric: He has a normal mood and affect.  Nursing note and vitals reviewed.   ED Course  Procedures (including critical care time) Labs Review Labs Reviewed  COMPREHENSIVE METABOLIC PANEL - Abnormal; Notable for the following:    Glucose, Bld 117 (*)    GFR calc non Af Amer 79 (*)    All other components within normal limits  URINALYSIS, ROUTINE W REFLEX MICROSCOPIC - Abnormal; Notable for the following:    APPearance CLOUDY (*)    Ketones, ur 15 (*)    All other components within normal limits  LIPASE, BLOOD - Abnormal; Notable  for the following:    Lipase 65 (*)    All other components within normal limits  CBC WITH DIFFERENTIAL  TROPONIN I    Imaging Review Ct Head Wo Contrast  10/15/2014   CLINICAL DATA:  Confusion.  Nausea, vomiting.  EXAM: CT HEAD WITHOUT CONTRAST  TECHNIQUE: Contiguous axial images were obtained from the base of the skull through the vertex without intravenous contrast.  COMPARISON:  08/09/2014  FINDINGS: There is significant central and cortical atrophy. Marked periventricular white matter changes are consistent with small vessel disease. Bilateral basal ganglia calcifications are present. There is no intra or extra-axial fluid collection or mass lesion. The basilar cisterns and ventricles have a normal appearance. There is no CT evidence for acute infarction or hemorrhage. Bone windows show no acute abnormalities. Previous right mastoidectomy. Chronic ethmoid sinusitis. Left ethmoid osteoma.  IMPRESSION: 1. Atrophy and small vessel disease. 2.  No evidence for acute intracranial abnormality. 3. Chronic sinusitis.   Electronically Signed   By: Shon Hale M.D.   On: 10/15/2014 18:32   Dg Abd Acute W/chest  10/15/2014   CLINICAL DATA:  Abdominal pain, vomiting and unresponsive.  EXAM: ACUTE ABDOMEN SERIES (ABDOMEN 2 VIEW & CHEST 1 VIEW)  COMPARISON:  Abdominal films  08/09/2014 and chest x-ray 08/10/2015.  FINDINGS: The upright chest x-ray demonstrates stable surgical changes. The cardiac silhouette, mediastinal and hilar contours are normal. The lungs are clear. No pleural effusion. The bony thorax is intact.  Two views of the abdomen demonstrate mild distention of the ascending and transverse colon. There are a few scattered air-filled small bowel loops with air-fluid levels. No significant distention. Stable IVC filter and gallstones. The soft tissue shadows of the abdomen are maintained. The bony structures are intact.  IMPRESSION: No acute cardiopulmonary findings.  No plain film findings for an acute abdominal process. Nonspecific bowel gas pattern.   Electronically Signed   By: Kalman Jewels M.D.   On: 10/15/2014 18:35     EKG Interpretation   Date/Time:  Thursday October 15 2014 17:07:37 EST Ventricular Rate:  58 PR Interval:  238 QRS Duration: 98 QT Interval:  500 QTC Calculation: 490 R Axis:   73 Text Interpretation:  Sinus bradycardia with 1st degree A-V block  Prolonged QT , new since last tracing Abnormal ECG Confirmed by Taylah Dubiel   MD-J, Aceton Kinnear ([id number]) on 10/15/2014 5:11:33 PM     1948  Pt is feeling much better.  He is no longer vomiting.  No other complaints.  Will give him some fluids to make sure he can keep them down.  Discussed lipase with family.  Comfortable with outpatient follow up.  2025  Patient tolerated oral fluids. He is feeling well and like to go home MDM   Final diagnoses:  Non-intractable vomiting with nausea, vomiting of unspecified type   Patient had an episode of nausea and vomiting today. His symptoms have all resolved at this point. He had a similar episode a couple of months ago with a negative evaluation. We discussed additional testing in the emergency department such as a CT scan of the abdomen considering his mild increase in lipase however the patient is feeling completely well at this point. He is not having any  pain. He is no longer having any nausea and tolerated oral fluids. I think it is reasonable to follow up with his primary doctor at this point. The family is comfortable with this and does not feel that additional evaluation is necessary at this time.  Dorie Rank, MD 10/15/14 2027

## 2014-10-15 NOTE — Discharge Instructions (Signed)

## 2014-10-15 NOTE — ED Notes (Signed)
MD at bedside. 

## 2014-10-15 NOTE — ED Notes (Signed)
Patient transported to X-ray 

## 2014-10-16 ENCOUNTER — Telehealth: Payer: Self-pay | Admitting: Family Medicine

## 2014-10-16 NOTE — Telephone Encounter (Signed)
May I schedule ED follow up for 10/26/14 at 11am there is Provider block.?

## 2014-10-16 NOTE — Telephone Encounter (Signed)
Are you asking if this needs to be a 30 minute appt? If so yes it does

## 2014-10-16 NOTE — Telephone Encounter (Addendum)
Caller name: Linton Flemings Relation to pt: daughter  Call back number: [phone]   Reason for call:  Pt of Dr. Charlett Blake was seen in [facility] ED for reocurring issues such as throwing up. Monday 10/26/14 at  (7 to 10 day follow up) pt daughter is available to talk after 11am (4minute slot).  Please advise if this ok

## 2014-10-16 NOTE — Telephone Encounter (Signed)
Is there any provider that has anything sooner to see patient

## 2014-10-19 NOTE — Telephone Encounter (Signed)
Scheduled ED follow up with Johnnette Gourd. Friday 10/23/14

## 2014-10-23 ENCOUNTER — Ambulatory Visit (INDEPENDENT_AMBULATORY_CARE_PROVIDER_SITE_OTHER): Payer: Medicare Other | Admitting: Medical

## 2014-10-23 ENCOUNTER — Encounter: Payer: Self-pay | Admitting: Medical

## 2014-10-23 VITALS — BP 148/63 | HR 61 | Temp 98.4°F | Ht 68.75 in | Wt 174.4 lb

## 2014-10-23 DIAGNOSIS — I25810 Atherosclerosis of coronary artery bypass graft(s) without angina pectoris: Secondary | ICD-10-CM

## 2014-10-23 DIAGNOSIS — R1115 Cyclical vomiting syndrome unrelated to migraine: Secondary | ICD-10-CM

## 2014-10-23 DIAGNOSIS — G43A0 Cyclical vomiting, not intractable: Secondary | ICD-10-CM

## 2014-10-23 DIAGNOSIS — R748 Abnormal levels of other serum enzymes: Secondary | ICD-10-CM | POA: Insufficient documentation

## 2014-10-23 LAB — LIPASE: LIPASE: 95 U/L — AB (ref 11.0–59.0)

## 2014-10-23 MED ORDER — RANITIDINE HCL 75 MG PO TABS: 75.0000 mg | ORAL_TABLET | Freq: Two times a day (BID) | ORAL | Status: DC

## 2014-10-23 NOTE — Assessment & Plan Note (Signed)
You have no symptoms now but intermittent episodes of intractable episodes of vomiting. Recently only mild elevated lipase. We will repeat lipase today. I will put you on low dose zantac. If your nausea returns then start zofran.

## 2014-10-23 NOTE — Assessment & Plan Note (Signed)
I will go ahead and refer you to GI for evaluation of the vomiting episodes. Referring since this is 2 episodes in last 2 months and etiology not determined.

## 2014-10-23 NOTE — Progress Notes (Signed)
Pre visit review using our clinic review tool, if applicable. No additional management support is needed unless otherwise documented below in the visit note. 

## 2014-10-23 NOTE — Patient Instructions (Signed)
You have no symptoms now but intermittent episodes of intractable episodes of vomiting. Recently only mild elevated lipase. We will repeat lipase today. I will put you on low dose zantac. If your nausea returns then start zofran.  I will go ahead and refer you to GI for evaluation of the vomiting episodes.  Follow up here 2 months or as needed.

## 2014-10-23 NOTE — Progress Notes (Signed)
Subjective:    Patient ID: Samuel Meyers, male    DOB: 09-May-1934, 80 y.o.   MRN: [id number]  HPI   Pt had some recent vomiting episodes for which he was evaluated in the ED. This was on the 10-15-2014. Pt had work up was negative recently.  Pt lipase was mild elevated recently. Pt ct of abdomen was negative. In September he had another episode of intractable vomiting.   Pt received IV medications in ED and symptoms resolved.  Currently, no GI symptoms. No alcohol use. Has done well since discharge.  Past Medical History  Diagnosis Date  . Hypertension   . Hyperlipidemia   . CAD (coronary artery disease)   . Pulmonary embolus     Time of CABG  . Dementia   . Depression   . Stroke     2013--Sept  . Gastroenteritis 10/20/2013  . Anemia 03/01/2014  . Prostate cancer   . Renal cyst, left 08/27/2014    5x6 cm on Ultrasound September 2015    History   Social History  . Marital Status: Married    Spouse Name: N/A    Number of Children: 1  . Years of Education: N/A   Occupational History  . security     Retired   Social History Main Topics  . Smoking status: Never Smoker   . Smokeless tobacco: Never Used  . Alcohol Use: No  . Drug Use: No  . Sexual Activity: Not on file   Other Topics Concern  . Not on file   Social History Narrative    Past Surgical History  Procedure Laterality Date  . Coronary artery bypass graft      [place]  . External ear surgery    . Tonsillectomy    . Tee without cardioversion  08/15/2012    Procedure: TRANSESOPHAGEAL ECHOCARDIOGRAM (TEE);  Surgeon: Fay Records, MD;  Location: [facility] ENDOSCOPY;  Service: Cardiovascular;  Laterality: N/A;    Family History  Problem Relation Age of Onset  . Coronary artery disease Father     MI in his 59s    Allergies  Allergen Reactions  . Niacin And Related Nausea And Vomiting  . Wellbutrin [Bupropion] Hives    Gi upset    Current Outpatient Prescriptions on File Prior to Visit    Medication Sig Dispense Refill  . acetaminophen (TYLENOL) 650 MG CR tablet Take 1,300 mg by mouth daily.    Marland Kitchen amLODipine (NORVASC) 5 MG tablet TAKE 1 TABLET DAILY 90 tablet 1  . atorvastatin (LIPITOR) 20 MG tablet TAKE 1 TABLET DAILY 90 tablet 1  . bimatoprost (LUMIGAN) 0.03 % ophthalmic solution Place 1 drop into both eyes at bedtime.    . carvedilol (COREG) 6.25 MG tablet Take 6.25 mg by mouth daily.    . cholecalciferol (VITAMIN D) 400 UNITS TABS Take 2,000 Units by mouth daily.     . clopidogrel (PLAVIX) 75 MG tablet TAKE 1 TABLET DAILY WITH BREAKFAST 90 tablet 0  . Coenzyme Q10 (CO Q-10) 100 MG CAPS Take 300 mg by mouth daily.    . Cyanocobalamin (VITAMIN B-12 CR) 1500 MCG TBCR Take by mouth daily.    . finasteride (PROSCAR) 5 MG tablet Take 5 mg by mouth daily.    Marland Kitchen HEMOCYTE-F 324-1 MG TABS TAKE 1 TABLET DAILY 90 each 1  . isosorbide mononitrate (IMDUR) 30 MG 24 hr tablet TAKE 1 TABLET DAILY 90 tablet 2  . lisinopril (PRINIVIL,ZESTRIL) 5 MG tablet TAKE 2 TABLETS  TWICE A DAY 360 tablet 0  . Misc Natural Products (OSTEO BI-FLEX JOINT SHIELD PO) Take 2 tablets by mouth daily.     . Multiple Vitamin (MULTIVITAMIN) capsule Take 1 capsule by mouth daily.    Marland Kitchen NAMENDA 10 MG tablet TAKE 1 TABLET TWICE A DAY 180 tablet 0  . Omega-3 Fatty Acids (FP FISH OIL PO) Take 2,400 mg by mouth daily.     . ondansetron (ZOFRAN ODT) 4 MG disintegrating tablet 4mg  ODT q4 hours prn nausea/vomit 10 tablet 0  . rivastigmine (EXELON) 9.5 mg/24hr Place 1 patch onto the skin daily.    . timolol (BETIMOL) 0.25 % ophthalmic solution Place 1-2 drops into both eyes 2 (two) times daily.    . vitamin C (ASCORBIC ACID) 500 MG tablet Take 500 mg by mouth daily.     No current facility-administered medications on file prior to visit.    BP 148/63 mmHg  Pulse 61  Temp(Src) 98.4 F (36.9 C) (Oral)  Ht 5' 8.75" (1.746 m)  Wt 174 lb 6.4 oz (79.107 kg)  BMI 25.95 kg/m2  SpO2 98%      Review of Systems   Constitutional: Negative for fever, chills and fatigue.  HENT: Negative for congestion, ear discharge, ear pain, nosebleeds, postnasal drip, rhinorrhea, sinus pressure, sneezing, sore throat and trouble swallowing.   Respiratory: Negative for cough, chest tightness, shortness of breath and wheezing.   Cardiovascular: Negative for chest pain and palpitations.  Gastrointestinal: Negative for nausea, vomiting, abdominal pain, diarrhea, constipation, blood in stool, abdominal distention, anal bleeding and rectal pain.  Genitourinary: Negative.   Musculoskeletal: Negative for back pain.  Neurological: Negative for dizziness, tremors, seizures, syncope, facial asymmetry, weakness, light-headedness, numbness and headaches.  Hematological: Negative for adenopathy. Does not bruise/bleed easily.  Psychiatric/Behavioral: Positive for confusion. Negative for suicidal ideas, behavioral problems, self-injury and dysphoric mood. The patient is not nervous/anxious.        Pleasant but hx of dementia.       Objective:   Physical Exam   General Appearance- Not in acute distress.  HEENT Eyes- Scleraeral/Conjuntiva-bilat- Not Yellow. Mouth & Throat- Normal.  Chest and Lung Exam Auscultation: Breath sounds:-Normal. Adventitious sounds:- No Adventitious sounds.  Cardiovascular Auscultation:Rythm - Regular. Heart Sounds -Normal heart sounds.  Abdomen Inspection:-Inspection Normal.  Palpation/Perucssion: Palpation and Percussion of the abdomen reveal- Non Tender, No Rebound tenderness, No rigidity(Guarding) and No Palpable abdominal masses.  Liver:-Normal.  Spleen:- Normal.   Rectal Anorectal Exam: Stool - Hemoccult of stool/mucous is Heme Negative. External - normal external exam. Internal - normal sphincter tone. No rectal mass.       Assessment & Plan:

## 2014-10-26 ENCOUNTER — Other Ambulatory Visit: Payer: Self-pay | Admitting: Family Medicine

## 2014-10-27 ENCOUNTER — Telehealth: Payer: Self-pay | Admitting: Family Medicine

## 2014-10-27 NOTE — Telephone Encounter (Signed)
Caller name: Linton Flemings Relation to pt: spouse  Call back number: [phone]   Reason for call:  Pt returning your call regarding father results

## 2014-11-03 ENCOUNTER — Ambulatory Visit: Payer: Medicare Other | Admitting: Medical

## 2014-11-05 ENCOUNTER — Ambulatory Visit (INDEPENDENT_AMBULATORY_CARE_PROVIDER_SITE_OTHER): Payer: Medicare Other | Admitting: Medical

## 2014-11-05 ENCOUNTER — Encounter: Payer: Self-pay | Admitting: Medical

## 2014-11-05 VITALS — BP 153/66 | HR 47 | Temp 97.6°F | Ht 68.75 in | Wt 178.4 lb

## 2014-11-05 DIAGNOSIS — I25810 Atherosclerosis of coronary artery bypass graft(s) without angina pectoris: Secondary | ICD-10-CM

## 2014-11-05 DIAGNOSIS — R748 Abnormal levels of other serum enzymes: Secondary | ICD-10-CM

## 2014-11-05 NOTE — Patient Instructions (Signed)
I am happy you feel well today. We will repeat your lipase level which has been slightly elevated.  Referral to GI is pending.  Avoid any alcohol or fatty foods. If pain abdomen,nausea or vomiting then notify us. If severe ED evaluation.  Follow up in 3 months or as needed.

## 2014-11-05 NOTE — Progress Notes (Signed)
   Subjective:    Patient ID: Samuel Meyers, male    DOB: [birth year], 80 y.o.   MRN: [id number]  HPI   Pt state he feels fine. No nausea and no vomiting,. No stomach pain. Pt stopped taking the zantac. He does not want to take the zantac. Family states he just does not like to take medications. No dark stools.     Review of Systems See hpi   see hpi Objective:   Physical Exam   General Appearance- Not in acute distress.  HEENT Eyes- Scleraeral/Conjuntiva-bilat- Not Yellow. Mouth & Throat- Normal.  Chest and Lung Exam Auscultation: Breath sounds:-Normal. Adventitious sounds:- No Adventitious sounds.  Cardiovascular Auscultation:Rythm - Regular. Heart Sounds -Normal heart sounds.  Abdomen Inspection:-Inspection Normal.  Palpation/Perucssion: Palpation and Percussion of the abdomen reveal- Non Tender, No Rebound tenderness, No rigidity(Guarding) and No Palpable abdominal masses.  Liver:-Normal.  Spleen:- Normal.           Assessment & Plan:

## 2014-11-05 NOTE — Assessment & Plan Note (Signed)
I am happy you feel well today. We will repeat your lipase level which has been slightly elevated.  Referral to GI is pending.  Avoid any alcohol or fatty foods. If pain abdomen,nausea or vomiting then notify us. If severe ED evaluation.

## 2014-11-05 NOTE — Progress Notes (Signed)
Pre visit review using our clinic review tool, if applicable. No additional management support is needed unless otherwise documented below in the visit note. 

## 2014-11-07 LAB — LIPASE: Lipase: 43 U/L (ref 11.0–59.0)

## 2014-11-10 IMAGING — CR DG ABDOMEN 1V
2 series · 2 of 2 positions shown · non-contrast
Comparison: None.

CLINICAL DATA: Vomiting.  Prostate carcinoma.

EXAM:
ABDOMEN - 1 VIEW

[t abdomen supine (1 of 2)]
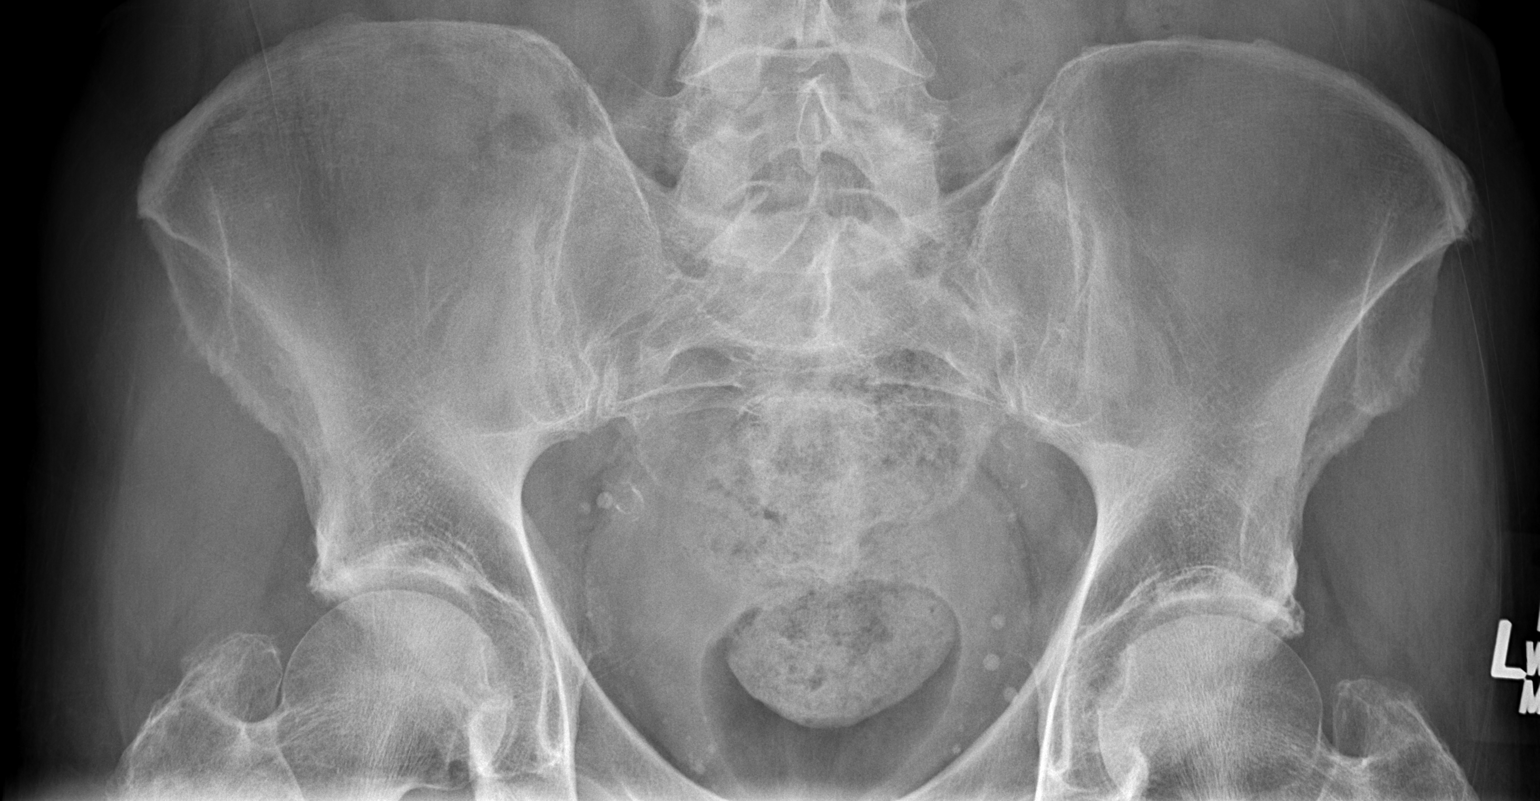

[t abdomen supine (2 of 2)]
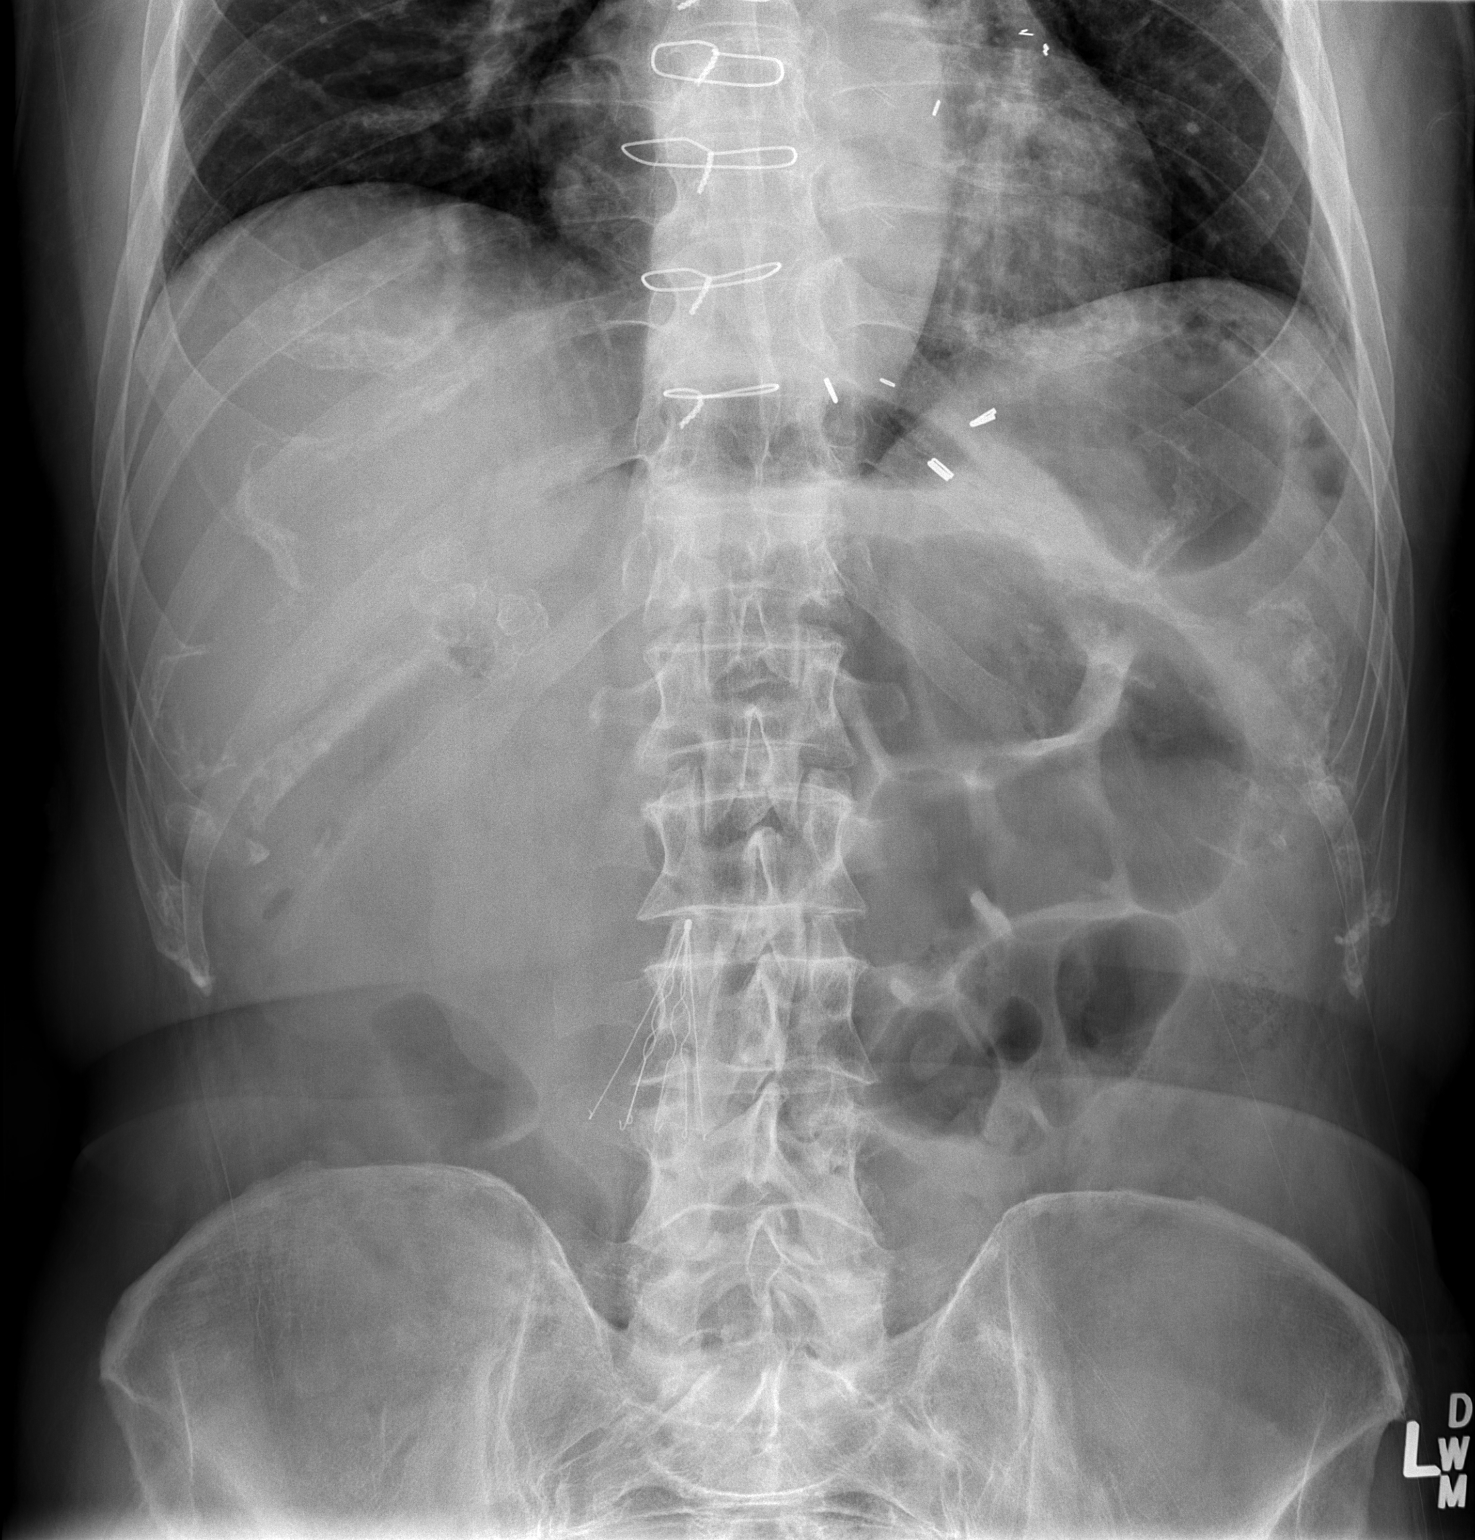

[2 of 2 positions shown; findings below may reference images not displayed]

FINDINGS: No evidence of dilated bowel loops. IVC filter is seen at the level
of L3-4. Surgical clips also seen in the epigastric region. Multiple
small calcified gallstones noted.
IMPRESSION: No acute findings.

Cholelithiasis.

## 2014-11-10 IMAGING — CT CT HEAD W/O CM
1 series · 16 of 30 positions shown, 20 images · non-contrast
Comparison: 08/13/2012

CLINICAL DATA: Severe vomiting. Hypertension. Dementia. Prostate
carcinoma.

EXAM:
CT HEAD WITHOUT CONTRAST
TECHNIQUE: Contiguous axial images were obtained from the base of the skull
through the vertex without intravenous contrast.

[Series 2: head 4.8 h37s · axial · 0.45mm/px · z∈[-144,+12]mm · 16 of 36 slices shown, 20 images]
[im 2/36  brain]
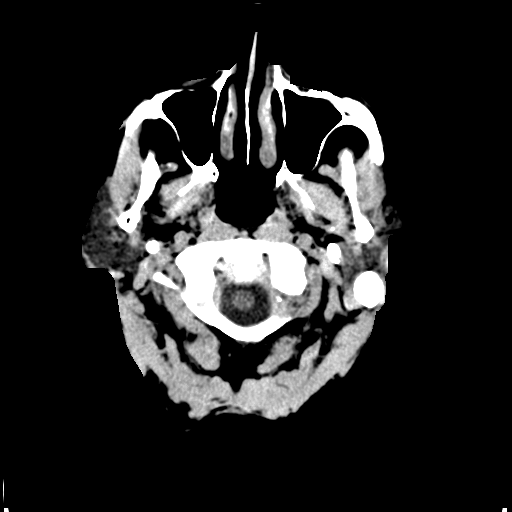
[im 2/36  bone]
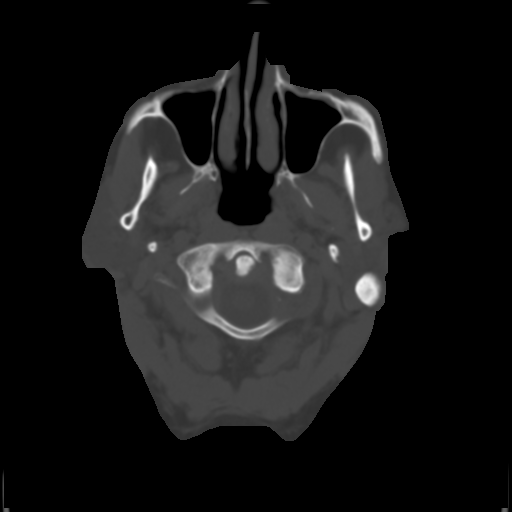
[im 4/36  brain]
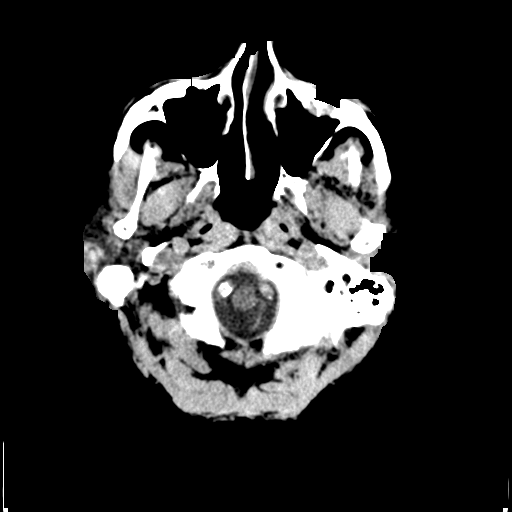
[im 7/36  brain]
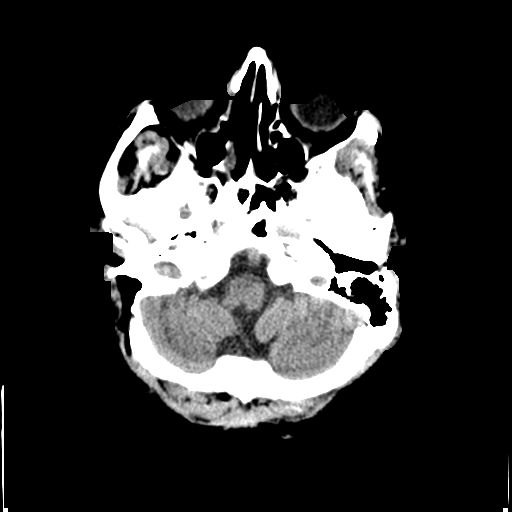
[im 9/36  brain]
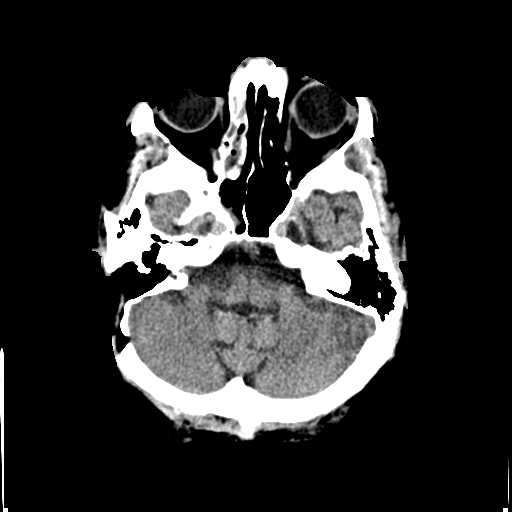
[im 10/36  brain]
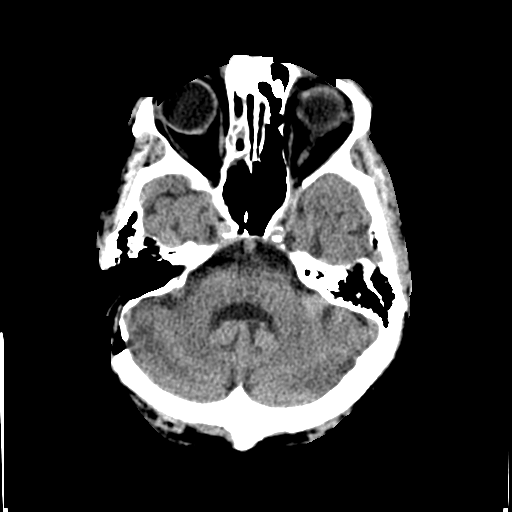
[im 10/36  bone]
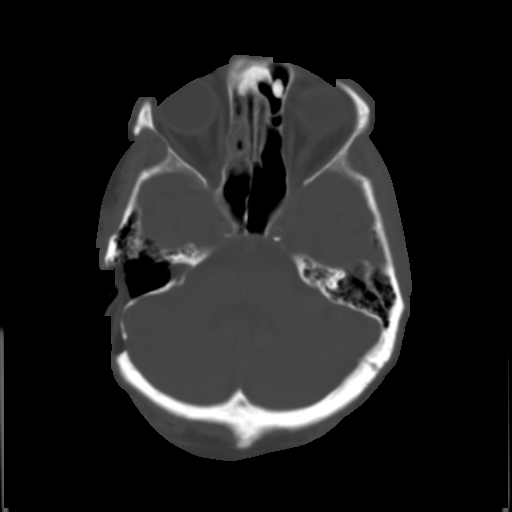
[im 13/36  brain]
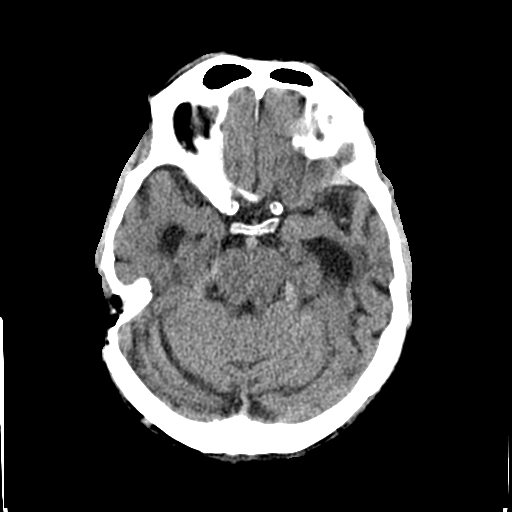
[im 15/36  brain]
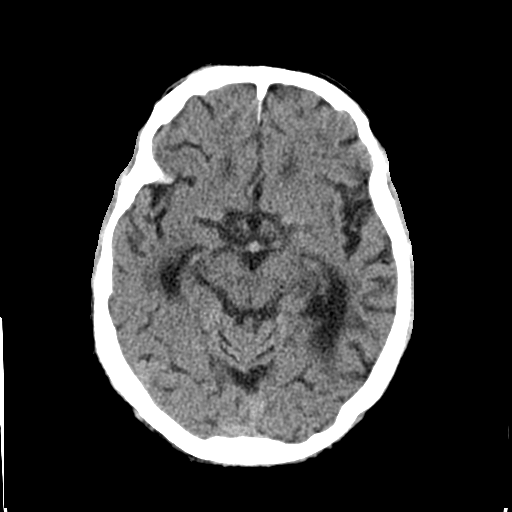
[im 17/36  brain]
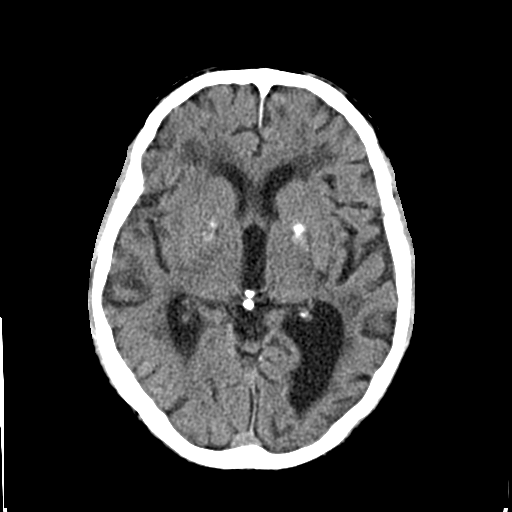
[im 19/36  brain]
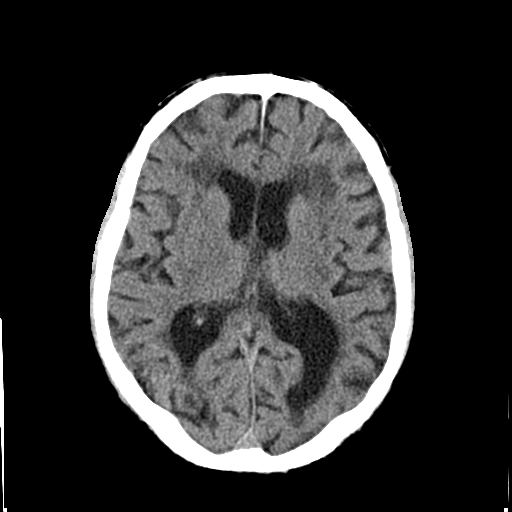
[im 19/36  bone]
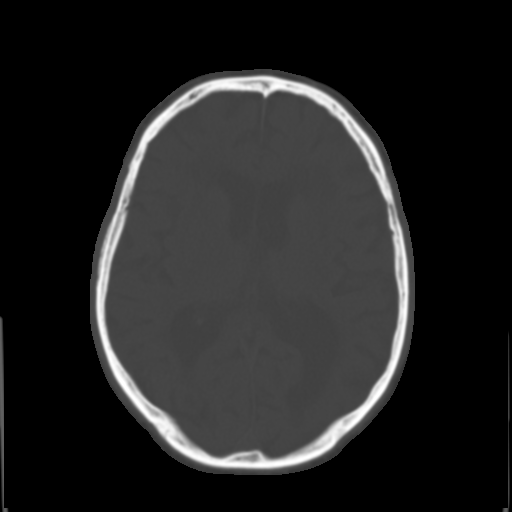
[im 21/36  brain]
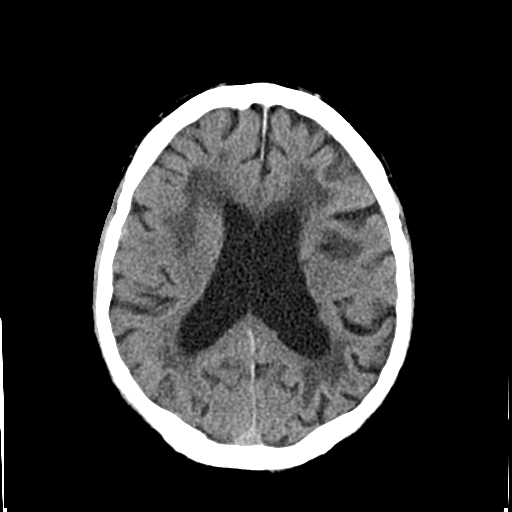
[im 23/36  brain]
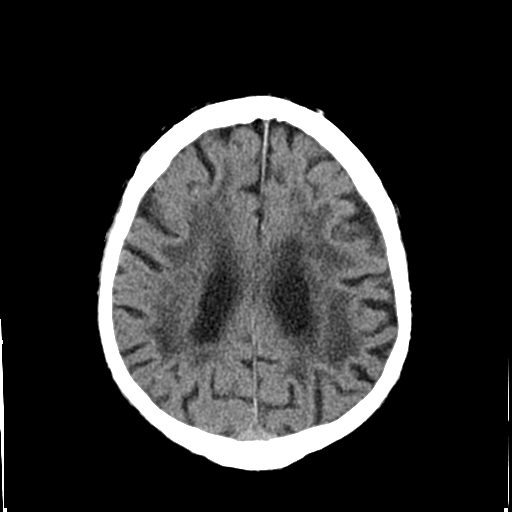
[im 26/36  brain]
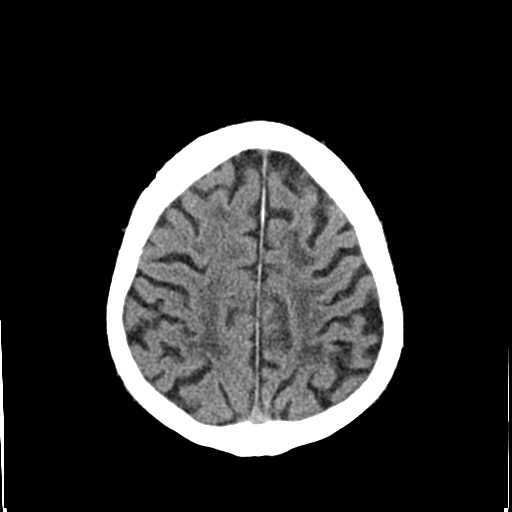
[im 27/36  brain]
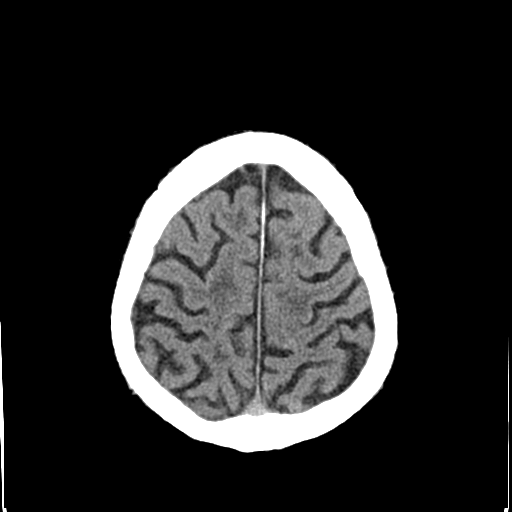
[im 27/36  bone]
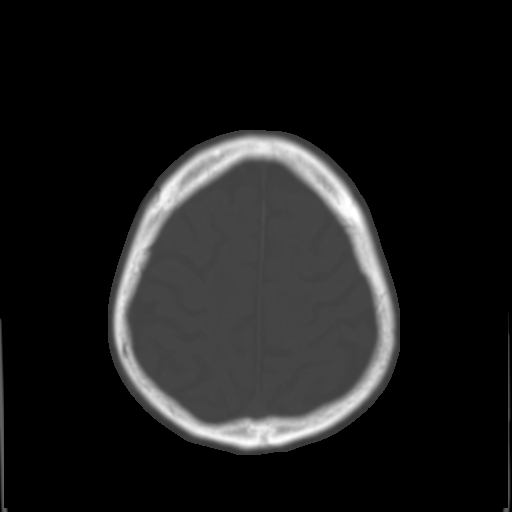
[im 29/36  brain]
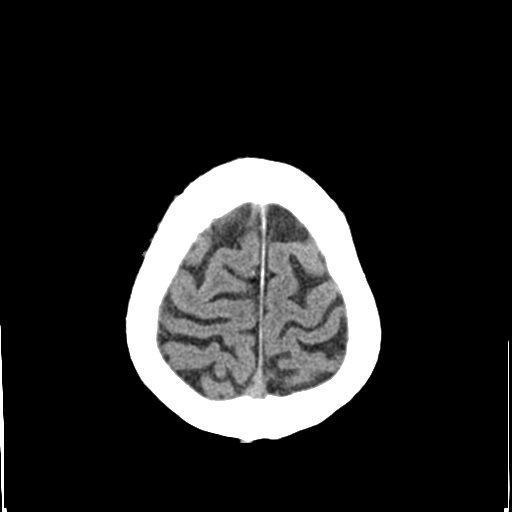
[im 32/36  brain]
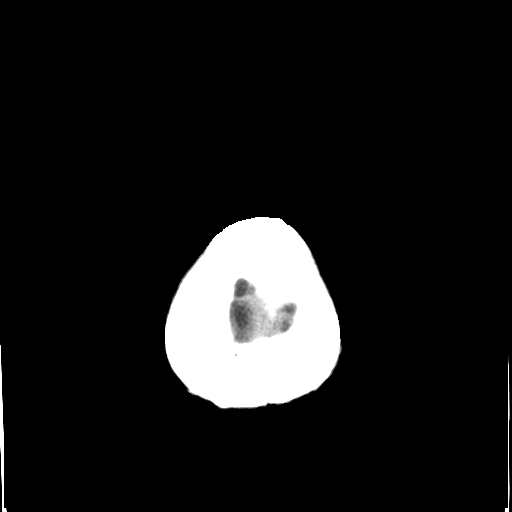
[im 34/36  brain]
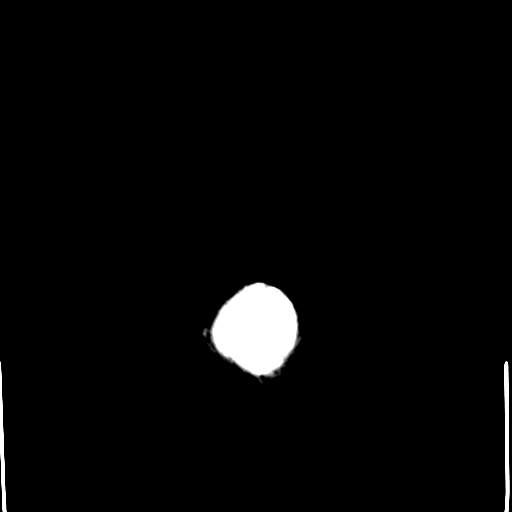

[16 of 30 positions shown; findings below may reference images not displayed]

FINDINGS: There is no evidence of intracranial hemorrhage, brain edema, or
other signs of acute infarction. There is no evidence of
intracranial mass lesion or mass effect. No abnormal extraaxial
fluid collections are identified.

Mild diffuse cerebral atrophy and severe chronic small vessel
disease are stable in appearance. Ventricles are stable in size. No
evidence of skull fracture. Postop changes again seen from previous
right mastoidectomy.
IMPRESSION: No acute intracranial abnormality.

Stable cerebral atrophy and severe chronic small vessel disease.

## 2014-11-10 NOTE — Telephone Encounter (Signed)
Spoke with patients daughter regarding results. Left message that patient needs to keep GI appointment.

## 2014-11-11 ENCOUNTER — Ambulatory Visit: Payer: Medicare Other | Admitting: Cardiology

## 2014-11-18 ENCOUNTER — Ambulatory Visit: Payer: Medicare Other | Admitting: Cardiology

## 2014-11-23 ENCOUNTER — Telehealth: Payer: Self-pay

## 2014-11-23 MED ORDER — ISOSORBIDE MONONITRATE ER 30 MG PO TB24: 30.0000 mg | ORAL_TABLET | Freq: Every day | ORAL | Status: DC

## 2014-11-23 NOTE — Telephone Encounter (Signed)
Neoma Laming Daughter [phone] [facility]  Rishard needs a refill on his isosorbide mononitrate (IMDUR) 30 MG [facility]

## 2014-11-23 NOTE — Telephone Encounter (Signed)
Advised patient's wife. 

## 2014-11-25 ENCOUNTER — Other Ambulatory Visit: Payer: Self-pay | Admitting: Family Medicine

## 2014-11-30 ENCOUNTER — Ambulatory Visit (INDEPENDENT_AMBULATORY_CARE_PROVIDER_SITE_OTHER): Payer: Medicare Other | Admitting: Family Medicine

## 2014-11-30 ENCOUNTER — Encounter: Payer: Self-pay | Admitting: Family Medicine

## 2014-11-30 VITALS — BP 138/78 | HR 59 | Temp 97.6°F | Ht 68.75 in | Wt 181.8 lb

## 2014-11-30 DIAGNOSIS — R748 Abnormal levels of other serum enzymes: Secondary | ICD-10-CM

## 2014-11-30 DIAGNOSIS — G43A0 Cyclical vomiting, not intractable: Secondary | ICD-10-CM

## 2014-11-30 DIAGNOSIS — F0391 Unspecified dementia with behavioral disturbance: Secondary | ICD-10-CM

## 2014-11-30 DIAGNOSIS — R1115 Cyclical vomiting syndrome unrelated to migraine: Secondary | ICD-10-CM

## 2014-11-30 DIAGNOSIS — R739 Hyperglycemia, unspecified: Secondary | ICD-10-CM

## 2014-11-30 DIAGNOSIS — E782 Mixed hyperlipidemia: Secondary | ICD-10-CM

## 2014-11-30 DIAGNOSIS — C61 Malignant neoplasm of prostate: Secondary | ICD-10-CM

## 2014-11-30 DIAGNOSIS — I10 Essential (primary) hypertension: Secondary | ICD-10-CM

## 2014-11-30 DIAGNOSIS — D649 Anemia, unspecified: Secondary | ICD-10-CM

## 2014-11-30 DIAGNOSIS — I25810 Atherosclerosis of coronary artery bypass graft(s) without angina pectoris: Secondary | ICD-10-CM

## 2014-11-30 DIAGNOSIS — E785 Hyperlipidemia, unspecified: Secondary | ICD-10-CM

## 2014-11-30 LAB — COMPLETE METABOLIC PANEL WITH GFR
ALBUMIN: 4.2 g/dL (ref 3.5–5.2)
ALT: 20 U/L (ref 0–53)
AST: 21 U/L (ref 0–37)
Alkaline Phosphatase: 68 U/L (ref 39–117)
BUN: 13 mg/dL (ref 6–23)
CALCIUM: 9.5 mg/dL (ref 8.4–10.5)
CO2: 28 meq/L (ref 19–32)
CREATININE: 0.87 mg/dL (ref 0.50–1.35)
Chloride: 106 mEq/L (ref 96–112)
GFR, EST NON AFRICAN AMERICAN: 83 mL/min
Glucose, Bld: 95 mg/dL (ref 70–99)
Potassium: 4.3 mEq/L (ref 3.5–5.3)
SODIUM: 139 meq/L (ref 135–145)
TOTAL PROTEIN: 7 g/dL (ref 6.0–8.3)
Total Bilirubin: 0.6 mg/dL (ref 0.2–1.2)

## 2014-11-30 MED ORDER — CARVEDILOL 6.25 MG PO TABS: 6.2500 mg | ORAL_TABLET | Freq: Every day | ORAL | Status: DC

## 2014-11-30 MED ORDER — ZOSTER VACCINE LIVE 19400 UNT/0.65ML ~~LOC~~ SOLR: 0.6500 mL | Freq: Once | SUBCUTANEOUS | Status: DC

## 2014-11-30 NOTE — Progress Notes (Signed)
Pre visit review using our clinic review tool, if applicable. No additional management support is needed unless otherwise documented below in the visit note. 

## 2014-11-30 NOTE — Patient Instructions (Signed)
Call insurance and check and see if they will pay for the Pneumonia 23 valent shot at this time and if they will pay for the Zostavax/Shingles shot and if so where is it cheapest?  Try a probiotic daily such as Digestive Advantage or [employer]'s Colon [employer]   Hypertension Hypertension, commonly called high blood pressure, is when the force of blood pumping through your arteries is too strong. Your arteries are the blood vessels that carry blood from your heart throughout your body. A blood pressure reading consists of a higher number over a lower number, such as 110/72. The higher number (systolic) is the pressure inside your arteries when your heart pumps. The lower number (diastolic) is the pressure inside your arteries when your heart relaxes. Ideally you want your blood pressure below 120/80. Hypertension forces your heart to work harder to pump blood. Your arteries may become narrow or stiff. Having hypertension puts you at risk for heart disease, stroke, and other problems.  RISK FACTORS Some risk factors for high blood pressure are controllable. Others are not.  Risk factors you cannot control include:   Race. You may be at higher risk if you are African American.  Age. Risk increases with age.  Gender. Men are at higher risk than women before age 81 years. After age 8, women are at higher risk than men. Risk factors you can control include:  Not getting enough exercise or physical activity.  Being overweight.  Getting too much fat, sugar, calories, or salt in your diet.  Drinking too much alcohol. SIGNS AND SYMPTOMS Hypertension does not usually cause signs or symptoms. Extremely high blood pressure (hypertensive crisis) may cause headache, anxiety, shortness of breath, and nosebleed. DIAGNOSIS  To check if you have hypertension, your health care provider will measure your blood pressure while you are seated, with your arm held at the level of your heart. It should be measured at  least twice using the same arm. Certain conditions can cause a difference in blood pressure between your right and left arms. A blood pressure reading that is higher than normal on one occasion does not mean that you need treatment. If one blood pressure reading is high, ask your health care provider about having it checked again. TREATMENT  Treating high blood pressure includes making lifestyle changes and possibly taking medicine. Living a healthy lifestyle can help lower high blood pressure. You may need to change some of your habits. Lifestyle changes may include:  Following the DASH diet. This diet is high in fruits, vegetables, and whole grains. It is low in salt, red meat, and added sugars.  Getting at least 2 hours of brisk physical activity every week.  Losing weight if necessary.  Not smoking.  Limiting alcoholic beverages.  Learning ways to reduce stress. If lifestyle changes are not enough to get your blood pressure under control, your health care provider may prescribe medicine. You may need to take more than one. Work closely with your health care provider to understand the risks and benefits. HOME CARE INSTRUCTIONS  Have your blood pressure rechecked as directed by your health care provider.   Take medicines only as directed by your health care provider. Follow the directions carefully. Blood pressure medicines must be taken as prescribed. The medicine does not work as well when you skip doses. Skipping doses also puts you at risk for problems.   Do not smoke.   Monitor your blood pressure at home as directed by your health care provider. SEEK  MEDICAL CARE IF:   You think you are having a reaction to medicines taken.  You have recurrent headaches or feel dizzy.  You have swelling in your ankles.  You have trouble with your vision. SEEK IMMEDIATE MEDICAL CARE IF:  You develop a severe headache or confusion.  You have unusual weakness, numbness, or feel  faint.  You have severe chest or abdominal pain.  You vomit repeatedly.  You have trouble breathing. MAKE SURE YOU:   Understand these instructions.  Will watch your condition.  Will get help right away if you are not doing well or get worse. Document Released: 11/20/2005 Document Revised: 04/06/2014 Document Reviewed: 09/12/2013 [facility] Patient Information 2015 [place], [place]. This information is not intended to replace advice given to you by your health care provider. Make sure you discuss any questions you have with your health care provider.

## 2014-12-01 ENCOUNTER — Encounter: Payer: Self-pay | Admitting: Family Medicine

## 2014-12-01 DIAGNOSIS — C61 Malignant neoplasm of prostate: Secondary | ICD-10-CM | POA: Insufficient documentation

## 2014-12-01 LAB — LIPID PANEL
Cholesterol: 111 mg/dL (ref 0–200)
HDL: 44.6 mg/dL (ref >39.00)
LDL Cholesterol: 53 mg/dL (ref 0–99)
NONHDL: 66.4
Total CHOL/HDL Ratio: 2
Triglycerides: 66 mg/dL (ref 0.0–149.0)
VLDL: 13.2 mg/dL (ref 0.0–40.0)

## 2014-12-01 LAB — CBC
HCT: 36.7 % — ABNORMAL LOW (ref 39.0–52.0)
HEMOGLOBIN: 12.1 g/dL — AB (ref 13.0–17.0)
MCHC: 33.1 g/dL (ref 30.0–36.0)
MCV: 92.6 fl (ref 78.0–100.0)
PLATELETS: 233 10*3/uL (ref 150.0–400.0)
RBC: 3.96 Mil/uL — ABNORMAL LOW (ref 4.22–5.81)
RDW: 13.1 % (ref 11.5–15.5)
WBC: 6.9 10*3/uL (ref 4.0–10.5)

## 2014-12-01 LAB — RENAL FUNCTION PANEL
Albumin: 4.2 g/dL (ref 3.5–5.2)
BUN: 15 mg/dL (ref 6–23)
CALCIUM: 9.4 mg/dL (ref 8.4–10.5)
CO2: 28 meq/L (ref 19–32)
Chloride: 106 mEq/L (ref 96–112)
Creatinine, Ser: 0.9 mg/dL (ref 0.4–1.5)
GFR: 90.06 mL/min (ref >60.00)
GLUCOSE: 87 mg/dL (ref 70–99)
POTASSIUM: 4.5 meq/L (ref 3.5–5.1)
Phosphorus: 3.7 mg/dL (ref 2.3–4.6)
Sodium: 141 mEq/L (ref 135–145)

## 2014-12-01 LAB — HEMOGLOBIN A1C: Hgb A1c MFr Bld: 6.1 % (ref 4.6–6.5)

## 2014-12-01 LAB — TSH: TSH: 1.39 u[IU]/mL (ref 0.35–4.50)

## 2014-12-01 NOTE — Progress Notes (Signed)
Samuel Meyers  [id number] 06/23/1934 12/01/2014      Progress Note-Follow Up  Subjective  Chief Complaint  Chief Complaint  Patient presents with  . Follow-up    3 mos    HPI  Patient is a 80 y.o. male in today for routine medical care. He is in today accompanied by his wife and daughter. They report no changes at home. His neurologist has stopped his Namenda and she's had no decline in his status. He is eating well. He's had no recurrence of nausea and vomiting after his episode back in November. Denies abdominal pain fevers, chills or anorexia. No other acute recent illness. They have had some biopsies recently with urology which confirmed some prostate cancer. 2 out of 12 biopsies were positive and they're waiting further testing before deciding on a plan of action. Denies CP/palp/SOB/HA/congestion/fevers/GI or GU c/o. Taking meds as prescribed  Past Medical History  Diagnosis Date  . Hypertension   . Hyperlipidemia   . CAD (coronary artery disease)   . Pulmonary embolus     Time of CABG  . Dementia   . Depression   . Stroke     2013--Sept  . Gastroenteritis 10/20/2013  . Anemia 03/01/2014  . Prostate cancer   . Renal cyst, left 08/27/2014    5x6 cm on Ultrasound September 2015    Past Surgical History  Procedure Laterality Date  . Coronary artery bypass graft      [place]  . External ear surgery    . Tonsillectomy    . Tee without cardioversion  08/15/2012    Procedure: TRANSESOPHAGEAL ECHOCARDIOGRAM (TEE);  Surgeon: Fay Records, MD;  Location: [facility] ENDOSCOPY;  Service: Cardiovascular;  Laterality: N/A;    Family History  Problem Relation Age of Onset  . Coronary artery disease Father     MI in his 22s    History   Social History  . Marital Status: Married    Spouse Name: N/A    Number of Children: 1  . Years of Education: N/A   Occupational History  . security     Retired   Social History Main Topics  . Smoking status: Never Smoker   .  Smokeless tobacco: Never Used  . Alcohol Use: No  . Drug Use: No  . Sexual Activity: Not on file   Other Topics Concern  . Not on file   Social History Narrative    Current Outpatient Prescriptions on File Prior to Visit  Medication Sig Dispense Refill  . acetaminophen (TYLENOL) 650 MG CR tablet Take 1,300 mg by mouth daily.    Marland Kitchen amLODipine (NORVASC) 5 MG tablet TAKE 1 TABLET DAILY 90 tablet 1  . atorvastatin (LIPITOR) 20 MG tablet TAKE 1 TABLET DAILY 90 tablet 1  . bimatoprost (LUMIGAN) 0.03 % ophthalmic solution Place 1 drop into both eyes at bedtime.    . cholecalciferol (VITAMIN D) 400 UNITS TABS Take 2,000 Units by mouth daily.     . clopidogrel (PLAVIX) 75 MG tablet TAKE 1 TABLET DAILY WITH BREAKFAST 90 tablet 0  . Coenzyme Q10 (CO Q-10) 100 MG CAPS Take 300 mg by mouth daily.    . Cyanocobalamin (VITAMIN B-12 CR) 1500 MCG TBCR Take by mouth daily.    . finasteride (PROSCAR) 5 MG tablet Take 5 mg by mouth daily.    Marland Kitchen HEMOCYTE-F 324-1 MG TABS TAKE 1 TABLET DAILY 90 each 0  . isosorbide mononitrate (IMDUR) 30 MG 24 hr tablet Take 1  tablet (30 mg total) by mouth daily. 90 tablet 2  . lisinopril (PRINIVIL,ZESTRIL) 5 MG tablet TAKE 2 TABLETS TWICE A DAY 360 tablet 0  . Misc Natural Products (OSTEO BI-FLEX JOINT SHIELD PO) Take 2 tablets by mouth daily.     . Multiple Vitamin (MULTIVITAMIN) capsule Take 1 capsule by mouth daily.    . Omega-3 Fatty Acids (FP FISH OIL PO) Take 2,400 mg by mouth daily.     . ondansetron (ZOFRAN ODT) 4 MG disintegrating tablet 4mg  ODT q4 hours prn nausea/vomit 10 tablet 0  . ranitidine (ZANTAC) 75 MG tablet Take 1 tablet (75 mg total) by mouth 2 (two) times daily. 60 tablet 1  . rivastigmine (EXELON) 9.5 mg/24hr Place 1 patch onto the skin daily.    . timolol (BETIMOL) 0.25 % ophthalmic solution Place 1-2 drops into both eyes 2 (two) times daily.    . vitamin C (ASCORBIC ACID) 500 MG tablet Take 500 mg by mouth daily.    Marland Kitchen NAMENDA 10 MG tablet TAKE 1  TABLET TWICE A DAY (Patient not taking: Reported on 11/30/2014) 180 tablet 0   No current facility-administered medications on file prior to visit.    Allergies  Allergen Reactions  . Niacin And Related Nausea And Vomiting  . Wellbutrin [Bupropion] Hives    Gi upset    Review of Systems  Review of Systems  Constitutional: Negative for fever and malaise/fatigue.  HENT: Negative for congestion.   Eyes: Negative for discharge.  Respiratory: Negative for shortness of breath.   Cardiovascular: Negative for chest pain, palpitations and leg swelling.  Gastrointestinal: Negative for nausea, abdominal pain and diarrhea.  Genitourinary: Negative for dysuria.  Musculoskeletal: Negative for falls.  Skin: Negative for rash.  Neurological: Negative for loss of consciousness and headaches.  Endo/Heme/Allergies: Negative for polydipsia.  Psychiatric/Behavioral: Negative for depression and suicidal ideas. The patient is not nervous/anxious and does not have insomnia.     Objective  BP 138/78 mmHg  Pulse 59  Temp(Src) 97.6 F (36.4 C) (Oral)  Ht 5' 8.75" (1.746 m)  Wt 181 lb 12.8 oz (82.464 kg)  BMI 27.05 kg/m2  SpO2 98%  Physical Exam  Physical Exam  Constitutional: He is oriented to person, place, and time and well-developed, well-nourished, and in no distress. No distress.  HENT:  Head: Normocephalic and atraumatic.  Eyes: Conjunctivae are normal.  Neck: Neck supple. No thyromegaly present.  Cardiovascular: Normal rate, regular rhythm and normal heart sounds.   Pulmonary/Chest: Effort normal and breath sounds normal. No respiratory distress.  Abdominal: He exhibits no distension and no mass. There is no tenderness.  Musculoskeletal: He exhibits no edema.  Neurological: He is alert and oriented to person, place, and time.  Skin: Skin is warm.  Psychiatric: Memory, affect and judgment normal.    Lab Results  Component Value Date   TSH 1.39 11/30/2014   Lab Results    Component Value Date   WBC 6.9 11/30/2014   HGB 12.1* 11/30/2014   HCT 36.7* 11/30/2014   MCV 92.6 11/30/2014   PLT 233.0 11/30/2014   Lab Results  Component Value Date   CREATININE 0.9 11/30/2014   CREATININE 0.87 11/30/2014   BUN 15 11/30/2014   BUN 13 11/30/2014   NA 141 11/30/2014   NA 139 11/30/2014   K 4.5 11/30/2014   K 4.3 11/30/2014   CL 106 11/30/2014   CL 106 11/30/2014   CO2 28 11/30/2014   CO2 28 11/30/2014   Lab Results  Component Value Date   ALT 20 11/30/2014   AST 21 11/30/2014   ALKPHOS 68 11/30/2014   BILITOT 0.6 11/30/2014   Lab Results  Component Value Date   CHOL 111 11/30/2014   Lab Results  Component Value Date   HDL 44.60 11/30/2014   Lab Results  Component Value Date   LDLCALC 53 11/30/2014   Lab Results  Component Value Date   TRIG 66.0 11/30/2014   Lab Results  Component Value Date   CHOLHDL 2 11/30/2014     Assessment & Plan  Hypertension Well controlled, no changes to meds. Encouraged heart healthy diet such as the DASH diet and exercise as tolerated.   CAD (coronary artery disease) of artery bypass graft No recent concerns, has an appt with cardiology in January  Nausea with vomiting Had 2 separate episodes, several months apart but has felt well with good appetite since his last episode back in November, They would like to hold off on evaluation by GI unless symptoms return.   Dementia Namenda has been discontinued by his neurologist since it was not helping.  Hyperlipidemia Encouraged heart healthy diet, increase exercise, avoid trans fats, consider a krill oil cap daily  Elevated lipase resolved  Anemia Mild, recurrent, Increase leafy greens, consider increased lean red meat and using cast iron cookware. Continue to monitor, report any concerns  Prostate cancer Asymptomatic, 2 of 12 biopsies positive, they are following with urology and for now just proceeding with watchful waiting.

## 2014-12-01 NOTE — Assessment & Plan Note (Signed)
Well controlled, no changes to meds. Encouraged heart healthy diet such as the DASH diet and exercise as tolerated.  °

## 2014-12-01 NOTE — Assessment & Plan Note (Signed)
Asymptomatic, 2 of 12 biopsies positive, they are following with urology and for now just proceeding with watchful waiting.

## 2014-12-01 NOTE — Assessment & Plan Note (Signed)
Encouraged heart healthy diet, increase exercise, avoid trans fats, consider a krill oil cap daily 

## 2014-12-01 NOTE — Assessment & Plan Note (Signed)
Mild, recurrent, Increase leafy greens, consider increased lean red meat and using cast iron cookware. Continue to monitor, report any concerns

## 2014-12-01 NOTE — Assessment & Plan Note (Signed)
resolved 

## 2014-12-01 NOTE — Assessment & Plan Note (Signed)
No recent concerns, has an appt with cardiology in January

## 2014-12-01 NOTE — Assessment & Plan Note (Signed)
Namenda has been discontinued by his neurologist since it was not helping.

## 2014-12-01 NOTE — Assessment & Plan Note (Signed)
Had 2 separate episodes, several months apart but has felt well with good appetite since his last episode back in November, They would like to hold off on evaluation by GI unless symptoms return.

## 2014-12-03 ENCOUNTER — Encounter: Payer: Self-pay | Admitting: General Practice

## 2014-12-11 ENCOUNTER — Emergency Department (HOSPITAL_BASED_OUTPATIENT_CLINIC_OR_DEPARTMENT_OTHER)
Admission: EM | Admit: 2014-12-11 | Discharge: 2014-12-11 | Disposition: A | Discharge status: Home / Self Care | Payer: Medicare Other | Attending: Emergency Medicine | Admitting: Emergency Medicine

## 2014-12-11 ENCOUNTER — Emergency Department (HOSPITAL_BASED_OUTPATIENT_CLINIC_OR_DEPARTMENT_OTHER): Payer: Medicare Other

## 2014-12-11 ENCOUNTER — Encounter (HOSPITAL_BASED_OUTPATIENT_CLINIC_OR_DEPARTMENT_OTHER): Payer: Self-pay

## 2014-12-11 DIAGNOSIS — R109 Unspecified abdominal pain: Secondary | ICD-10-CM | POA: Insufficient documentation

## 2014-12-11 DIAGNOSIS — F329 Major depressive disorder, single episode, unspecified: Secondary | ICD-10-CM | POA: Insufficient documentation

## 2014-12-11 DIAGNOSIS — Z8673 Personal history of transient ischemic attack (TIA), and cerebral infarction without residual deficits: Secondary | ICD-10-CM | POA: Diagnosis not present

## 2014-12-11 DIAGNOSIS — F0390 Unspecified dementia without behavioral disturbance: Secondary | ICD-10-CM | POA: Diagnosis not present

## 2014-12-11 DIAGNOSIS — Z8546 Personal history of malignant neoplasm of prostate: Secondary | ICD-10-CM | POA: Insufficient documentation

## 2014-12-11 DIAGNOSIS — Z951 Presence of aortocoronary bypass graft: Secondary | ICD-10-CM | POA: Insufficient documentation

## 2014-12-11 DIAGNOSIS — I2510 Atherosclerotic heart disease of native coronary artery without angina pectoris: Secondary | ICD-10-CM | POA: Insufficient documentation

## 2014-12-11 DIAGNOSIS — R112 Nausea with vomiting, unspecified: Secondary | ICD-10-CM | POA: Diagnosis present

## 2014-12-11 DIAGNOSIS — Z86711 Personal history of pulmonary embolism: Secondary | ICD-10-CM | POA: Diagnosis not present

## 2014-12-11 DIAGNOSIS — Z8719 Personal history of other diseases of the digestive system: Secondary | ICD-10-CM | POA: Insufficient documentation

## 2014-12-11 DIAGNOSIS — I10 Essential (primary) hypertension: Secondary | ICD-10-CM | POA: Insufficient documentation

## 2014-12-11 DIAGNOSIS — R1110 Vomiting, unspecified: Secondary | ICD-10-CM

## 2014-12-11 DIAGNOSIS — Z79899 Other long term (current) drug therapy: Secondary | ICD-10-CM | POA: Diagnosis not present

## 2014-12-11 DIAGNOSIS — Q6100 Congenital renal cyst, unspecified: Secondary | ICD-10-CM | POA: Insufficient documentation

## 2014-12-11 DIAGNOSIS — E785 Hyperlipidemia, unspecified: Secondary | ICD-10-CM | POA: Diagnosis not present

## 2014-12-11 DIAGNOSIS — D649 Anemia, unspecified: Secondary | ICD-10-CM | POA: Diagnosis not present

## 2014-12-11 LAB — COMPREHENSIVE METABOLIC PANEL
ALT: 25 U/L (ref 0–53)
ANION GAP: 8 (ref 5–15)
AST: 31 U/L (ref 0–37)
Albumin: 4.6 g/dL (ref 3.5–5.2)
Alkaline Phosphatase: 68 U/L (ref 39–117)
BUN: 12 mg/dL (ref 6–23)
CALCIUM: 9.3 mg/dL (ref 8.4–10.5)
CHLORIDE: 103 meq/L (ref 96–112)
CO2: 25 mmol/L (ref 19–32)
Creatinine, Ser: 0.85 mg/dL (ref 0.50–1.35)
GFR, EST NON AFRICAN AMERICAN: 81 mL/min — AB (ref >90)
Glucose, Bld: 136 mg/dL — ABNORMAL HIGH (ref 70–99)
POTASSIUM: 3.9 mmol/L (ref 3.5–5.1)
Sodium: 136 mmol/L (ref 135–145)
TOTAL PROTEIN: 7.8 g/dL (ref 6.0–8.3)
Total Bilirubin: 0.9 mg/dL (ref 0.3–1.2)

## 2014-12-11 LAB — URINALYSIS, ROUTINE W REFLEX MICROSCOPIC
Bilirubin Urine: NEGATIVE
Glucose, UA: NEGATIVE mg/dL
Hgb urine dipstick: NEGATIVE
KETONES UR: 15 mg/dL — AB
Leukocytes, UA: NEGATIVE
Nitrite: NEGATIVE
PROTEIN: NEGATIVE mg/dL
SPECIFIC GRAVITY, URINE: 1.017 (ref 1.005–1.030)
Urobilinogen, UA: 0.2 mg/dL (ref 0.0–1.0)
pH: 8.5 — ABNORMAL HIGH (ref 5.0–8.0)

## 2014-12-11 LAB — CBC WITH DIFFERENTIAL/PLATELET
BASOS ABS: 0 10*3/uL (ref 0.0–0.1)
BASOS PCT: 0 % (ref 0–1)
EOS ABS: 0.1 10*3/uL (ref 0.0–0.7)
EOS PCT: 2 % (ref 0–5)
HEMATOCRIT: 40.4 % (ref 39.0–52.0)
Hemoglobin: 14 g/dL (ref 13.0–17.0)
Lymphocytes Relative: 23 % (ref 12–46)
Lymphs Abs: 1.8 10*3/uL (ref 0.7–4.0)
MCH: 31.2 pg (ref 26.0–34.0)
MCHC: 34.7 g/dL (ref 30.0–36.0)
MCV: 90 fL (ref 78.0–100.0)
MONO ABS: 0.5 10*3/uL (ref 0.1–1.0)
Monocytes Relative: 7 % (ref 3–12)
NEUTROS ABS: 5.5 10*3/uL (ref 1.7–7.7)
Neutrophils Relative %: 69 % (ref 43–77)
Platelets: 249 10*3/uL (ref 150–400)
RBC: 4.49 MIL/uL (ref 4.22–5.81)
RDW: 11.9 % (ref 11.5–15.5)
WBC: 8 10*3/uL (ref 4.0–10.5)

## 2014-12-11 LAB — LIPASE, BLOOD: Lipase: 37 U/L (ref 11–59)

## 2014-12-11 LAB — TROPONIN I

## 2014-12-11 LAB — I-STAT CG4 LACTIC ACID, ED: Lactic Acid, Venous: 2.13 mmol/L (ref 0.5–2.2)

## 2014-12-11 MED ORDER — ONDANSETRON HCL 4 MG/2ML IJ SOLN
4.0000 mg | Freq: Once | INTRAMUSCULAR | Status: AC
  Administered 2014-12-11: 4 mg via INTRAVENOUS
  Filled 2014-12-11: qty 2

## 2014-12-11 MED ORDER — SODIUM CHLORIDE 0.9 % IV BOLUS (SEPSIS)
1000.0000 mL | Freq: Once | INTRAVENOUS | Status: AC
  Administered 2014-12-11: 1000 mL via INTRAVENOUS

## 2014-12-11 MED ORDER — IOHEXOL 300 MG/ML  SOLN
100.0000 mL | Freq: Once | INTRAMUSCULAR | Status: AC | PRN
  Administered 2014-12-11: 100 mL via INTRAVENOUS

## 2014-12-11 MED ORDER — IOHEXOL 300 MG/ML  SOLN
25.0000 mL | Freq: Once | INTRAMUSCULAR | Status: AC | PRN
  Administered 2014-12-11: 25 mL via ORAL

## 2014-12-11 MED ORDER — METOCLOPRAMIDE HCL 5 MG/ML IJ SOLN
5.0000 mg | Freq: Once | INTRAMUSCULAR | Status: AC
  Administered 2014-12-11: 5 mg via INTRAVENOUS
  Filled 2014-12-11: qty 2

## 2014-12-11 MED ORDER — ONDANSETRON 4 MG PO TBDP: ORAL_TABLET | ORAL | Status: DC

## 2014-12-11 NOTE — ED Provider Notes (Signed)
CSN: [id number]     Arrival date & time 12/11/14  1613 History   First MD Initiated Contact with Patient 12/11/14 1635     Chief Complaint  Patient presents with  . Emesis     (Consider location/radiation/quality/duration/timing/severity/associated sxs/prior Treatment) HPI Comments: Patient presents to the ER for evaluation of nausea and vomiting. Symptoms began earlier today. Patient has had recurrent problems with nausea and vomiting recently. Patient has not been able to hold anything down today, has had persistent vomiting prior to arrival. He has not been expressing any significant abdominal pain.  Patient is a 80 y.o. male presenting with vomiting.  Emesis   Past Medical History  Diagnosis Date  . Hypertension   . Hyperlipidemia   . CAD (coronary artery disease)   . Pulmonary embolus     Time of CABG  . Dementia   . Depression   . Stroke     2013--Sept  . Gastroenteritis 10/20/2013  . Anemia 03/01/2014  . Prostate cancer   . Renal cyst, left 08/27/2014    5x6 cm on Ultrasound September 2015   Past Surgical History  Procedure Laterality Date  . Coronary artery bypass graft      [place]  . External ear surgery    . Tonsillectomy    . Tee without cardioversion  08/15/2012    Procedure: TRANSESOPHAGEAL ECHOCARDIOGRAM (TEE);  Surgeon: Fay Records, MD;  Location: [facility] ENDOSCOPY;  Service: Cardiovascular;  Laterality: N/A;   Family History  Problem Relation Age of Onset  . Coronary artery disease Father     MI in his 39s   History  Substance Use Topics  . Smoking status: Never Smoker   . Smokeless tobacco: Never Used  . Alcohol Use: No    Review of Systems  Gastrointestinal: Positive for nausea and vomiting.  All other systems reviewed and are negative.     Allergies  Niacin and related and Wellbutrin  Home Medications   Prior to Admission medications   Medication Sig Start Date End Date Taking? Authorizing Provider  acetaminophen (TYLENOL) 650 MG  CR tablet Take 1,300 mg by mouth daily.    Historical Provider, MD  amLODipine (NORVASC) 5 MG tablet TAKE 1 TABLET DAILY 08/31/14   Mosie Lukes, MD  atorvastatin (LIPITOR) 20 MG tablet TAKE 1 TABLET DAILY 09/16/14   Mosie Lukes, MD  bimatoprost (LUMIGAN) 0.03 % ophthalmic solution Place 1 drop into both eyes at bedtime.    Historical Provider, MD  carvedilol (COREG) 6.25 MG tablet Take 1 tablet (6.25 mg total) by mouth daily. 11/30/14   Mosie Lukes, MD  cholecalciferol (VITAMIN D) 400 UNITS TABS Take 2,000 Units by mouth daily.     Historical Provider, MD  clopidogrel (PLAVIX) 75 MG tablet TAKE 1 TABLET DAILY WITH BREAKFAST 10/15/14   Mosie Lukes, MD  Coenzyme Q10 (CO Q-10) 100 MG CAPS Take 300 mg by mouth daily.    Historical Provider, MD  Cyanocobalamin (VITAMIN B-12 CR) 1500 MCG TBCR Take by mouth daily.    Historical Provider, MD  finasteride (PROSCAR) 5 MG tablet Take 5 mg by mouth daily.    Historical Provider, MD  HEMOCYTE-F 324-1 MG TABS TAKE 1 TABLET DAILY 10/26/14   Mosie Lukes, MD  isosorbide mononitrate (IMDUR) 30 MG 24 hr tablet Take 1 tablet (30 mg total) by mouth daily. 11/23/14   Mosie Lukes, MD  lisinopril (PRINIVIL,ZESTRIL) 5 MG tablet TAKE 2 TABLETS TWICE A DAY 09/14/14  Mosie Lukes, MD  Misc Natural Products (OSTEO BI-FLEX JOINT SHIELD PO) Take 2 tablets by mouth daily.     Historical Provider, MD  Multiple Vitamin (MULTIVITAMIN) capsule Take 1 capsule by mouth daily.    Historical Provider, MD  NAMENDA 10 MG tablet TAKE 1 TABLET TWICE A DAY Patient not taking: Reported on 11/30/2014 08/04/14   Mosie Lukes, MD  Omega-3 Fatty Acids (FP FISH OIL PO) Take 2,400 mg by mouth daily.     Historical Provider, MD  ondansetron (ZOFRAN ODT) 4 MG disintegrating tablet 4mg  ODT q4 hours prn nausea/vomit 08/09/14   Ephraim Hamburger, MD  ranitidine (ZANTAC) 75 MG tablet Take 1 tablet (75 mg total) by mouth 2 (two) times daily. 10/23/14   Meriam Sprague Saguier, PA-C   rivastigmine (EXELON) 9.5 mg/24hr Place 1 patch onto the skin daily.    Historical Provider, MD  timolol (BETIMOL) 0.25 % ophthalmic solution Place 1-2 drops into both eyes 2 (two) times daily.    Historical Provider, MD  vitamin C (ASCORBIC ACID) 500 MG tablet Take 500 mg by mouth daily.    Historical Provider, MD  zoster vaccine live, PF, (ZOSTAVAX) 60630 UNT/0.65ML injection Inject 19,400 Units into the skin once. 11/30/14   Mosie Lukes, MD   BP 175/85 mmHg  Pulse 68  Temp(Src) 97.5 F (36.4 C) (Axillary)  Resp 20  Wt 181 lb (82.101 kg)  SpO2 98% Physical Exam  Constitutional: He is oriented to person, place, and time. He appears well-developed and well-nourished. No distress.  HENT:  Head: Normocephalic and atraumatic.  Right Ear: Hearing normal.  Left Ear: Hearing normal.  Nose: Nose normal.  Mouth/Throat: Oropharynx is clear and moist and mucous membranes are normal.  Eyes: Conjunctivae and EOM are normal. Pupils are equal, round, and reactive to light.  Neck: Normal range of motion. Neck supple.  Cardiovascular: Regular rhythm, S1 normal and S2 normal.  Exam reveals no gallop and no friction rub.   No murmur heard. Pulmonary/Chest: Effort normal and breath sounds normal. No respiratory distress. He exhibits no tenderness.  Abdominal: Soft. Normal appearance and bowel sounds are normal. There is no hepatosplenomegaly. There is no tenderness. There is no rebound, no guarding, no tenderness at McBurney's point and negative Murphy's sign. No hernia.  Musculoskeletal: Normal range of motion.  Neurological: He is alert and oriented to person, place, and time. He has normal strength. No cranial nerve deficit or sensory deficit. Coordination normal. GCS eye subscore is 4. GCS verbal subscore is 5. GCS motor subscore is 6.  Skin: Skin is warm, dry and intact. No rash noted. No cyanosis.  Psychiatric: He has a normal mood and affect. His speech is normal and behavior is normal. Thought  content normal.  Nursing note and vitals reviewed.   ED Course  Procedures (including critical care time) Labs Review Labs Reviewed  COMPREHENSIVE METABOLIC PANEL - Abnormal; Notable for the following:    Glucose, Bld 136 (*)    GFR calc non Af Amer 81 (*)    All other components within normal limits  URINALYSIS, ROUTINE W REFLEX MICROSCOPIC - Abnormal; Notable for the following:    pH 8.5 (*)    Ketones, ur 15 (*)    All other components within normal limits  CBC WITH DIFFERENTIAL  LIPASE, BLOOD  TROPONIN I  I-STAT CG4 LACTIC ACID, ED    Imaging Review Ct Abdomen Pelvis W Contrast  12/11/2014   CLINICAL DATA:  81 year old male with persistent  vomiting today.  EXAM: CT ABDOMEN AND PELVIS WITH CONTRAST  TECHNIQUE: Multidetector CT imaging of the abdomen and pelvis was performed using the standard protocol following bolus administration of intravenous contrast.  CONTRAST:  164mL OMNIPAQUE IOHEXOL 300 MG/ML SOLN, 38mL OMNIPAQUE IOHEXOL 300 MG/ML SOLN  COMPARISON:  No priors.  FINDINGS: Lower chest: Postoperative changes of CABG, with LIMA to the LAD. Atherosclerotic calcifications in the left main, left anterior descending, left circumflex and right coronary arteries. Small thin-walled cyst in the inferior segment of the lingula incidentally noted. Small hiatal hernia.  Hepatobiliary: Numerous small calcified gallstones lying dependently in the gallbladder. No current findings to suggest acute cholecystitis at this time. No cystic or solid hepatic lesions. No intra or extrahepatic biliary ductal dilatation.  Pancreas: Unremarkable.  Spleen: Small volume of perisplenic ascites. Otherwise, the spleen itself is unremarkable in appearance.  Adrenals/Urinary Tract: Normal appearance of the adrenal glands and the left kidney. 6 cm low-attenuation lesion in the lateral aspect of the interpolar region of the right kidney is compatible with a large simple cysts. 8 mm low attenuation lesion in the lower  pole the right kidney is too small to characterize, but is also favored to represent a small cyst. No hydroureteronephrosis. Urinary bladder is normal in appearance.  Stomach/Bowel: Normal appearance of the stomach. No pathologic dilatation of small bowel are:. However, there is increased enhancement throughout the small bowel, particularly in the region of the jejunum, where the mucosa appears thickened and avidly enhances. There is a small amount of interloop fluid in this region, and a trace volume of adjacent ascites. The more distal small bowel also demonstrates some increased enhancement of the mucosa, but is smaller in caliber. Relatively large volume of liquid appearing stool in the distal colon and rectum.  Vascular/Lymphatic: Extensive atherosclerosis throughout the abdominal and pelvic vasculature, without evidence of aneurysm or dissection. IVC filter in position with tip terminating below the level of the renal veins. No lymphadenopathy noted in the abdomen or pelvis.  Reproductive: Prostate gland and seminal vesicles are unremarkable in appearance.  Other: Small volume of ascites.  No pneumoperitoneum.  Musculoskeletal: Median sternotomy wires. There are no aggressive appearing lytic or blastic lesions noted in the visualized portions of the skeleton.  IMPRESSION: 1. Areas of mucosal thickening and avid enhancement throughout the small bowel, most pronounced in the region of the jejunum, favored to reflect underlying enteritis. This is associated with a small volume of ascites. 2. Cholelithiasis without evidence to suggest acute cholecystitis at this time. 3. Atherosclerosis, including left main and 3 vessel coronary artery disease. Status post median sternotomy for CABG, including LIMA to LAD. 4. Additional incidental findings, as above.   Electronically Signed   By: Vinnie Langton M.D.   On: 12/11/2014 19:57   Dg Abd Acute W/chest  12/11/2014   CLINICAL DATA:  One day history of vomiting. History  of prostate carcinoma  EXAM: ACUTE ABDOMEN SERIES (ABDOMEN 2 VIEW & CHEST 1 VIEW)  COMPARISON:  October 15, 2014  FINDINGS: PA chest: No edema or consolidation. The heart size and pulmonary vascularity are normal. No adenopathy. Patient is status post coronary artery bypass grafting.  Supine and upright abdomen: There is no appreciable bowel obstruction. There are multiple air-fluid levels. No free air. There are laminated gallstones in right upper quadrant. There is a filter in the inferior vena cava with the apex at L2-3 directed superiorly. There are phleboliths in the pelvis.  IMPRESSION: Multiple air-fluid levels without appreciable bowel dilatation.  Suspect enteritis or early ileus. Early partial obstruction is a differential consideration. No free air. Multiple gallstones. Filter in inferior vena cava. Lungs clear.   Electronically Signed   By: Lowella Grip M.D.   On: 12/11/2014 18:06     EKG Interpretation None      MDM   Final diagnoses:  Vomiting  Abdominal pain    She presents to the ER for evaluation of nausea and vomiting. Patient had sudden onset of vomiting earlier today. Patient has had several similar presentations for this. Patient has lab work is unremarkable. Plain film x-ray was equivocal, could not rule out early partial small bowel obstruction. CT scan, however, does not show any evidence of obstruction. Patient is feeling much better after treatment. No further vomiting here in the ER, appears back to his baseline. He will be discharged, follow-up with his primary doctor. The family tells me that he does have referral for follow-up to GI already pending.    Orpah Greek, MD 12/11/14 2107

## 2014-12-11 NOTE — Discharge Instructions (Signed)

## 2014-12-11 NOTE — ED Notes (Signed)
Received IV Zofran 4mg  PTA by EMS

## 2014-12-11 NOTE — ED Notes (Signed)
Patient transported to CT 

## 2014-12-11 NOTE — ED Notes (Signed)
Recurrent vomiting that started this am.

## 2014-12-21 ENCOUNTER — Telehealth: Payer: Self-pay | Admitting: Family Medicine

## 2014-12-21 NOTE — Telephone Encounter (Signed)
Caller name: Clinical biochemist for [employer]  Relation to pt:  Other  Call back number: [phone]   Reason for call:  RN faxed [place] 2 form today, please complete form and fax back to [phone]. Requesting orders for skilled nursing unit.

## 2014-12-22 NOTE — Telephone Encounter (Signed)
Form received, filled out and forwarded to Dr. Charlett Blake. JG//CMA

## 2014-12-22 NOTE — Progress Notes (Signed)
HPI: FU coronary disease. He is status post coronary artery bypassing graft in [place] in 1995. Patient had a LIMA to the LAD and a saphenous vein graft to his marginal. He did have postoperative atrial fibrillation and a pulmonary embolus based on outside notes. Cardiac catheterization in 2007 showed patent grafts and normal LV function; normal renal arteries. Patient admitted to [facility] in September of 2013 with a CVA. Echocardiogram in September of 2013 showed an ejection fraction of 50-55%, trace aortic insufficiency. Transesophageal echocardiogram in September of 2013 showed normal LV function and trace aortic and mitral regurgitation. Carotid Dopplers in September of 2013 showed no significant extracranial carotid artery stenosis. Outpatient monitor showed sinus with occasional PVCs. Last myoview in Oct 2013 showed normal perfusion and EF 60. Abd CT 1/16 showed no aneurysm. Since I last saw him, he denies dyspnea, exertional chest pain, syncope or edema.  Current Outpatient Prescriptions  Medication Sig Dispense Refill  . acetaminophen (TYLENOL) 650 MG CR tablet Take 1,300 mg by mouth daily.    Marland Kitchen amLODipine (NORVASC) 5 MG tablet TAKE 1 TABLET DAILY 90 tablet 1  . atorvastatin (LIPITOR) 20 MG tablet TAKE 1 TABLET DAILY 90 tablet 1  . bimatoprost (LUMIGAN) 0.03 % ophthalmic solution Place 1 drop into both eyes at bedtime.    . carvedilol (COREG) 6.25 MG tablet Take 1 tablet (6.25 mg total) by mouth daily. 90 tablet 1  . cholecalciferol (VITAMIN D) 400 UNITS TABS Take 2,000 Units by mouth daily.     . clopidogrel (PLAVIX) 75 MG tablet TAKE 1 TABLET DAILY WITH BREAKFAST 90 tablet 0  . Coenzyme Q10 (CO Q-10) 100 MG CAPS Take 300 mg by mouth daily.    . Cyanocobalamin (VITAMIN B-12 CR) 1500 MCG TBCR Take by mouth daily.    . finasteride (PROSCAR) 5 MG tablet Take 5 mg by mouth daily.    Marland Kitchen HEMOCYTE-F 324-1 MG TABS TAKE 1 TABLET DAILY 90 each 0  . isosorbide mononitrate  (IMDUR) 30 MG 24 hr tablet Take 1 tablet (30 mg total) by mouth daily. 90 tablet 2  . lisinopril (PRINIVIL,ZESTRIL) 5 MG tablet TAKE 2 TABLETS TWICE A DAY 360 tablet 0  . Misc Natural Products (OSTEO BI-FLEX JOINT SHIELD PO) Take 2 tablets by mouth daily.     . Multiple Vitamin (MULTIVITAMIN) capsule Take 1 capsule by mouth daily.    Marland Kitchen NAMENDA 10 MG tablet TAKE 1 TABLET TWICE A DAY (Patient not taking: Reported on 11/30/2014) 180 tablet 0  . Omega-3 Fatty Acids (FP FISH OIL PO) Take 2,400 mg by mouth daily.     . ondansetron (ZOFRAN ODT) 4 MG disintegrating tablet 4mg  ODT q4 hours prn nausea/vomit 10 tablet 0  . ranitidine (ZANTAC) 75 MG tablet Take 1 tablet (75 mg total) by mouth 2 (two) times daily. 60 tablet 1  . rivastigmine (EXELON) 9.5 mg/24hr Place 1 patch onto the skin daily.    . timolol (BETIMOL) 0.25 % ophthalmic solution Place 1-2 drops into both eyes 2 (two) times daily.    . vitamin C (ASCORBIC ACID) 500 MG tablet Take 500 mg by mouth daily.    Marland Kitchen zoster vaccine live, PF, (ZOSTAVAX) 57017 UNT/0.65ML injection Inject 19,400 Units into the skin once. 1 each 0   No current facility-administered medications for this visit.     Past Medical History  Diagnosis Date  . Hypertension   . Hyperlipidemia   . CAD (coronary artery disease)   .  Pulmonary embolus     Time of CABG  . Dementia   . Depression   . Stroke     2013--Sept  . Gastroenteritis 10/20/2013  . Anemia 03/01/2014  . Prostate cancer   . Renal cyst, left 08/27/2014    5x6 cm on Ultrasound September 2015    Past Surgical History  Procedure Laterality Date  . Coronary artery bypass graft      [place]  . External ear surgery    . Tonsillectomy    . Tee without cardioversion  08/15/2012    Procedure: TRANSESOPHAGEAL ECHOCARDIOGRAM (TEE);  Surgeon: Fay Records, MD;  Location: [facility] ENDOSCOPY;  Service: Cardiovascular;  Laterality: N/A;    History   Social History  . Marital Status: Married    Spouse Name:  N/A    Number of Children: 1  . Years of Education: N/A   Occupational History  . security     Retired   Social History Main Topics  . Smoking status: Never Smoker   . Smokeless tobacco: Never Used  . Alcohol Use: No  . Drug Use: No  . Sexual Activity: Not on file   Other Topics Concern  . Not on file   Social History Narrative    ROS: no fevers or chills, productive cough, hemoptysis, dysphasia, odynophagia, melena, hematochezia, dysuria, hematuria, rash, seizure activity, orthopnea, PND, pedal edema, claudication. Remaining systems are negative.  Physical Exam: Well-developed well-nourished in no acute distress.  Skin is warm and dry.  HEENT is normal.  Neck is supple.  Chest is clear to auscultation with normal expansion.  Cardiovascular exam is regular rate and rhythm.  Abdominal exam nontender or distended. No masses palpated. Extremities show no edema. neuro grossly intact  ECG     This encounter was created in error - please disregard.

## 2014-12-25 ENCOUNTER — Encounter: Payer: Medicare Other | Admitting: Cardiology

## 2014-12-26 ENCOUNTER — Other Ambulatory Visit: Payer: Self-pay | Admitting: Family Medicine

## 2014-12-29 NOTE — Telephone Encounter (Signed)
Last filled:  10/15/14 Amt:  90, 0 refill Last OV:  11/30/14  Med filled.

## 2014-12-30 ENCOUNTER — Encounter: Payer: Self-pay | Admitting: Cardiology

## 2014-12-30 ENCOUNTER — Ambulatory Visit (INDEPENDENT_AMBULATORY_CARE_PROVIDER_SITE_OTHER): Payer: Medicare Other | Admitting: Cardiology

## 2014-12-30 VITALS — BP 126/72 | HR 52 | Ht 68.0 in | Wt 178.0 lb

## 2014-12-30 DIAGNOSIS — I10 Essential (primary) hypertension: Secondary | ICD-10-CM

## 2014-12-30 DIAGNOSIS — I2583 Coronary atherosclerosis due to lipid rich plaque: Principal | ICD-10-CM

## 2014-12-30 DIAGNOSIS — I2510 Atherosclerotic heart disease of native coronary artery without angina pectoris: Secondary | ICD-10-CM

## 2014-12-30 DIAGNOSIS — E785 Hyperlipidemia, unspecified: Secondary | ICD-10-CM

## 2014-12-30 DIAGNOSIS — R072 Precordial pain: Secondary | ICD-10-CM

## 2014-12-30 NOTE — Patient Instructions (Signed)
Your physician wants you to follow-up in: ONE YEAR WITH DR CRENSHAW You will receive a reminder letter in the mail two months in advance. If you don't receive a letter, please call our office to schedule the follow-up appointment.   STOP AMLODIPINE 

## 2014-12-30 NOTE — Assessment & Plan Note (Signed)
- 

## 2014-12-30 NOTE — Assessment & Plan Note (Signed)
Continue statin. 

## 2014-12-30 NOTE — Assessment & Plan Note (Signed)
Patient is on multiple medications and there is some question as to whether he is taking all of them. His blood pressure is reasonably well controlled. I will discontinue Norvasc to try and consolidate. Follow blood pressure and adjust as needed.

## 2014-12-30 NOTE — Assessment & Plan Note (Signed)
Patient has intermittent chest and abdominal pain that is nonexertional. This is not new. It does not sound cardiac. I would like to be conservative given his dementia and his daughter is in agreement. Will not pursue further cardiac workup at this point.

## 2014-12-30 NOTE — Progress Notes (Signed)
HPI: FU coronary disease. He is status post coronary artery bypassing graft in [place] in 1995. Patient had a LIMA to the LAD and a saphenous vein graft to his marginal. He did have postoperative atrial fibrillation and a pulmonary embolus based on outside notes. Cardiac catheterization in 2007 showed patent grafts and normal LV function; normal renal arteries. Patient admitted to [facility] in September of 2013 with a CVA. Echocardiogram in September of 2013 showed an ejection fraction of 50-55%, trace aortic insufficiency. Transesophageal echocardiogram in September of 2013 showed normal LV function and trace aortic and mitral regurgitation. Carotid Dopplers in September of 2013 showed no significant extracranial carotid artery stenosis. Outpatient monitor showed sinus with occasional PVCs. Last myoview in Oct 2013 showed normal perfusion and EF 60. Since I last saw him, the patient is having significant worsening of his dementia based on his daughter's report. He occasionally has pain in his chest that is not exertional. He has had this intermittently for years. There is no exertional chest pain or dyspnea.  Current Outpatient Prescriptions  Medication Sig Dispense Refill  . acetaminophen (TYLENOL) 650 MG CR tablet Take 1,300 mg by mouth daily.    Marland Kitchen amLODipine (NORVASC) 5 MG tablet TAKE 1 TABLET DAILY 90 tablet 1  . atorvastatin (LIPITOR) 20 MG tablet TAKE 1 TABLET DAILY 90 tablet 1  . bimatoprost (LUMIGAN) 0.03 % ophthalmic solution Place 1 drop into both eyes at bedtime.    . carvedilol (COREG) 6.25 MG tablet Take 1 tablet (6.25 mg total) by mouth daily. 90 tablet 1  . cholecalciferol (VITAMIN D) 400 UNITS TABS Take 2,000 Units by mouth daily.     . clopidogrel (PLAVIX) 75 MG tablet TAKE 1 TABLET DAILY WITH BREAKFAST 90 tablet 0  . Coenzyme Q10 (CO Q-10) 100 MG CAPS Take 300 mg by mouth daily.    . Cyanocobalamin (VITAMIN B-12 CR) 1500 MCG TBCR Take by mouth daily.    .  finasteride (PROSCAR) 5 MG tablet Take 5 mg by mouth daily.    Marland Kitchen HEMOCYTE-F 324-1 MG TABS TAKE 1 TABLET DAILY 90 each 0  . isosorbide mononitrate (IMDUR) 30 MG 24 hr tablet Take 1 tablet (30 mg total) by mouth daily. 90 tablet 2  . lisinopril (PRINIVIL,ZESTRIL) 5 MG tablet TAKE 2 TABLETS TWICE A DAY 360 tablet 0  . Misc Natural Products (OSTEO BI-FLEX JOINT SHIELD PO) Take 2 tablets by mouth daily.     . Multiple Vitamin (MULTIVITAMIN) capsule Take 1 capsule by mouth daily.    . Omega-3 Fatty Acids (FP FISH OIL PO) Take 2,400 mg by mouth daily.     . ondansetron (ZOFRAN ODT) 4 MG disintegrating tablet 4mg  ODT q4 hours prn nausea/vomit 10 tablet 0  . rivastigmine (EXELON) 9.5 mg/24hr Place 1 patch onto the skin daily.    . timolol (BETIMOL) 0.25 % ophthalmic solution Place 1-2 drops into both eyes 2 (two) times daily.    . vitamin C (ASCORBIC ACID) 500 MG tablet Take 500 mg by mouth daily.    Marland Kitchen zoster vaccine live, PF, (ZOSTAVAX) 64332 UNT/0.65ML injection Inject 19,400 Units into the skin once. 1 each 0  . NAMENDA 10 MG tablet TAKE 1 TABLET TWICE A DAY (Patient not taking: Reported on 11/30/2014) 180 tablet 0   No current facility-administered medications for this visit.     Past Medical History  Diagnosis Date  . Hypertension   . Hyperlipidemia   . CAD (coronary artery disease)   .  Pulmonary embolus     Time of CABG  . Dementia   . Depression   . Stroke     2013--Sept  . Gastroenteritis 10/20/2013  . Anemia 03/01/2014  . Prostate cancer   . Renal cyst, left 08/27/2014    5x6 cm on Ultrasound September 2015    Past Surgical History  Procedure Laterality Date  . Coronary artery bypass graft      [place]  . External ear surgery    . Tonsillectomy    . Tee without cardioversion  08/15/2012    Procedure: TRANSESOPHAGEAL ECHOCARDIOGRAM (TEE);  Surgeon: Fay Records, MD;  Location: [facility] ENDOSCOPY;  Service: Cardiovascular;  Laterality: N/A;    History   Social History  .  Marital Status: Married    Spouse Name: N/A    Number of Children: 1  . Years of Education: N/A   Occupational History  . security     Retired   Social History Main Topics  . Smoking status: Never Smoker   . Smokeless tobacco: Never Used  . Alcohol Use: No  . Drug Use: No  . Sexual Activity: Not on file   Other Topics Concern  . Not on file   Social History Narrative    ROS: worsening dementia as well as occasional nausea and vomiting but no fevers or chills, productive cough, hemoptysis, dysphasia, odynophagia, melena, hematochezia, dysuria, hematuria, rash, seizure activity, orthopnea, PND, pedal edema, claudication. Remaining systems are negative.  Physical Exam: Well-developed well-nourished in no acute distress.  Skin is warm and dry.  HEENT is normal.  Neck is supple.  Chest is clear to auscultation with normal expansion.  Cardiovascular exam is regular rate and rhythm.  Abdominal exam nontender or distended. No masses palpated. Extremities show no edema. neuro grossly intact   10/15/2014-sinus rhythm, first-degree AV block, prolonged QT interval.

## 2015-01-06 ENCOUNTER — Other Ambulatory Visit: Payer: Self-pay | Admitting: Family Medicine

## 2015-01-06 NOTE — Telephone Encounter (Signed)
Medication Detail      Disp Refills Start End     HEMOCYTE-F 324-1 MG TABS 90 each 0 10/26/2014     Sig: TAKE 1 TABLET DAILY    E-Prescribing Status: Receipt confirmed by pharmacy (10/26/2014 9:18 AM     Rx request to pharmacy/SLS

## 2015-01-13 ENCOUNTER — Ambulatory Visit: Payer: Medicare Other | Admitting: Cardiology

## 2015-02-05 ENCOUNTER — Other Ambulatory Visit: Payer: Self-pay | Admitting: Family Medicine

## 2015-02-19 ENCOUNTER — Ambulatory Visit (INDEPENDENT_AMBULATORY_CARE_PROVIDER_SITE_OTHER): Payer: Medicare Other | Admitting: Family Medicine

## 2015-02-19 ENCOUNTER — Encounter: Payer: Self-pay | Admitting: Family Medicine

## 2015-02-19 VITALS — BP 132/72 | HR 57 | Temp 97.5°F | Ht 70.0 in | Wt 178.5 lb

## 2015-02-19 DIAGNOSIS — D649 Anemia, unspecified: Secondary | ICD-10-CM

## 2015-02-19 DIAGNOSIS — Z23 Encounter for immunization: Secondary | ICD-10-CM | POA: Diagnosis not present

## 2015-02-19 DIAGNOSIS — E785 Hyperlipidemia, unspecified: Secondary | ICD-10-CM | POA: Diagnosis not present

## 2015-02-19 DIAGNOSIS — I2510 Atherosclerotic heart disease of native coronary artery without angina pectoris: Secondary | ICD-10-CM | POA: Diagnosis not present

## 2015-02-19 DIAGNOSIS — R739 Hyperglycemia, unspecified: Secondary | ICD-10-CM

## 2015-02-19 DIAGNOSIS — I10 Essential (primary) hypertension: Secondary | ICD-10-CM | POA: Diagnosis not present

## 2015-02-19 DIAGNOSIS — E782 Mixed hyperlipidemia: Secondary | ICD-10-CM | POA: Diagnosis not present

## 2015-02-19 DIAGNOSIS — F0391 Unspecified dementia with behavioral disturbance: Secondary | ICD-10-CM

## 2015-02-19 LAB — COMPREHENSIVE METABOLIC PANEL
ALT: 23 U/L (ref 0–53)
AST: 19 U/L (ref 0–37)
Albumin: 4.4 g/dL (ref 3.5–5.2)
Alkaline Phosphatase: 68 U/L (ref 39–117)
BUN: 15 mg/dL (ref 6–23)
CALCIUM: 9.6 mg/dL (ref 8.4–10.5)
CO2: 26 mEq/L (ref 19–32)
Chloride: 104 mEq/L (ref 96–112)
Creat: 0.75 mg/dL (ref 0.50–1.35)
GLUCOSE: 101 mg/dL — AB (ref 70–99)
POTASSIUM: 4.4 meq/L (ref 3.5–5.3)
SODIUM: 140 meq/L (ref 135–145)
Total Bilirubin: 0.9 mg/dL (ref 0.2–1.2)
Total Protein: 7.2 g/dL (ref 6.0–8.3)

## 2015-02-19 LAB — CBC
HCT: 37.6 % — ABNORMAL LOW (ref 39.0–52.0)
Hemoglobin: 12.8 g/dL — ABNORMAL LOW (ref 13.0–17.0)
MCH: 30.5 pg (ref 26.0–34.0)
MCHC: 34 g/dL (ref 30.0–36.0)
MCV: 89.5 fL (ref 78.0–100.0)
MPV: 10.9 fL (ref 8.6–12.4)
Platelets: 252 10*3/uL (ref 150–400)
RBC: 4.2 MIL/uL — AB (ref 4.22–5.81)
RDW: 12.9 % (ref 11.5–15.5)
WBC: 7.4 10*3/uL (ref 4.0–10.5)

## 2015-02-19 LAB — LIPID PANEL
CHOL/HDL RATIO: 2.3 ratio
CHOLESTEROL: 89 mg/dL (ref 0–200)
HDL: 39 mg/dL — AB (ref >=40)
LDL Cholesterol: 39 mg/dL (ref 0–99)
TRIGLYCERIDES: 53 mg/dL (ref <150)
VLDL: 11 mg/dL (ref 0–40)

## 2015-02-19 MED ORDER — ZOSTER VACCINE LIVE 19400 UNT/0.65ML ~~LOC~~ SOLR: 0.6500 mL | Freq: Once | SUBCUTANEOUS | Status: DC

## 2015-02-19 NOTE — Patient Instructions (Signed)
Start a probiotic such as Digestive Advantage or phillips Colon Health Add Fiber such as gummies or metamucil cookies or benefiber  Constipation Constipation is when a person has fewer than three bowel movements a week, has difficulty having a bowel movement, or has stools that are dry, hard, or larger than normal. As people grow older, constipation is more common. If you try to fix constipation with medicines that make you have a bowel movement (laxatives), the problem may get worse. Long-term laxative use may cause the muscles of the colon to become weak. A low-fiber diet, not taking in enough fluids, and taking certain medicines may make constipation worse.  CAUSES   Certain medicines, such as antidepressants, pain medicine, iron supplements, antacids, and water pills.   Certain diseases, such as diabetes, irritable bowel syndrome (IBS), thyroid disease, or depression.   Not drinking enough water.   Not eating enough fiber-rich foods.   Stress or travel.   Lack of physical activity or exercise.   Ignoring the urge to have a bowel movement.   Using laxatives too much.  SIGNS AND SYMPTOMS   Having fewer than three bowel movements a week.   Straining to have a bowel movement.   Having stools that are hard, dry, or larger than normal.   Feeling full or bloated.   Pain in the lower abdomen.   Not feeling relief after having a bowel movement.  DIAGNOSIS  Your health care provider will take a medical history and perform a physical exam. Further testing may be done for severe constipation. Some tests may include:  A barium enema X-ray to examine your rectum, colon, and, sometimes, your small intestine.   A sigmoidoscopy to examine your lower colon.   A colonoscopy to examine your entire colon. TREATMENT  Treatment will depend on the severity of your constipation and what is causing it. Some dietary treatments include drinking more fluids and eating more  fiber-rich foods. Lifestyle treatments may include regular exercise. If these diet and lifestyle recommendations do not help, your health care provider may recommend taking over-the-counter laxative medicines to help you have bowel movements. Prescription medicines may be prescribed if over-the-counter medicines do not work.  HOME CARE INSTRUCTIONS   Eat foods that have a lot of fiber, such as fruits, vegetables, whole grains, and beans.  Limit foods high in fat and processed sugars, such as french fries, hamburgers, cookies, candies, and soda.   A fiber supplement may be added to your diet if you cannot get enough fiber from foods.   Drink enough fluids to keep your urine clear or pale yellow.   Exercise regularly or as directed by your health care provider.   Go to the restroom when you have the urge to go. Do not hold it.   Only take over-the-counter or prescription medicines as directed by your health care provider. Do not take other medicines for constipation without talking to your health care provider first.  [place] IF:   You have bright red blood in your stool.   Your constipation lasts for more than 4 days or gets worse.   You have abdominal or rectal pain.   You have thin, pencil-like stools.   You have unexplained weight loss. MAKE SURE YOU:   Understand these instructions.  Will watch your condition.  Will get help right away if you are not doing well or get worse. Document Released: 08/18/2004 Document Revised: 11/25/2013 Document Reviewed: 09/01/2013 [facility] Patient Information 2015 [place], [place].  This information is not intended to replace advice given to you by your health care provider. Make sure you discuss any questions you have with your health care provider.

## 2015-02-19 NOTE — Progress Notes (Signed)
Pre visit review using our clinic review tool, if applicable. No additional management support is needed unless otherwise documented below in the visit note. 

## 2015-02-20 LAB — TSH: TSH: 1.197 u[IU]/mL (ref 0.350–4.500)

## 2015-02-22 ENCOUNTER — Other Ambulatory Visit: Payer: Self-pay | Admitting: *Deleted

## 2015-02-22 DIAGNOSIS — D509 Iron deficiency anemia, unspecified: Secondary | ICD-10-CM

## 2015-03-04 ENCOUNTER — Encounter: Payer: Self-pay | Admitting: Family Medicine

## 2015-03-04 NOTE — Progress Notes (Signed)
Samuel Meyers  [id number] 10-Aug-1934 03/04/2015      Progress Note-Follow Up  Subjective  Chief Complaint  Chief Complaint  Patient presents with  . Follow-up    HPI  Patient is a 81 y.o. male in today for routine medical care. Patient is in today, a by his wife and daughter. They report overall is doing well. He has periods of agitation but this is manageable. He only takes his medications intermittently. No recent illness. Denies CP/palp/SOB/HA/congestion/fevers/GI or GU c/o. Taking meds as prescribed  Past Medical History  Diagnosis Date  . Hypertension   . Hyperlipidemia   . CAD (coronary artery disease)   . Pulmonary embolus     Time of CABG  . Dementia   . Depression   . Stroke     2013--Sept  . Gastroenteritis 10/20/2013  . Anemia 03/01/2014  . Prostate cancer   . Renal cyst, left 08/27/2014    5x6 cm on Ultrasound September 2015    Past Surgical History  Procedure Laterality Date  . Coronary artery bypass graft      [place]  . External ear surgery    . Tonsillectomy    . Tee without cardioversion  08/15/2012    Procedure: TRANSESOPHAGEAL ECHOCARDIOGRAM (TEE);  Surgeon: Fay Records, MD;  [facility] ENDOSCOPY;  Service: Cardiovascular;  Laterality: N/A;    Family History  Problem Relation Age of Onset  . Coronary artery disease Father     MI in his 60s    History   Social History  . Marital Status: Married    Spouse Name: N/A  . Number of Children: 1  . Years of Education: N/A   Occupational History  . security     Retired   Social History Main Topics  . Smoking status: Never Smoker   . Smokeless tobacco: Never Used  . Alcohol Use: No  . Drug Use: No  . Sexual Activity: Not on file   Other Topics Concern  . Not on file   Social History Narrative    Current Outpatient Prescriptions on File Prior to Visit  Medication Sig Dispense Refill  . acetaminophen (TYLENOL) 650 MG CR tablet Take 1,300 mg by mouth daily.    Marland Kitchen  atorvastatin (LIPITOR) 20 MG tablet TAKE 1 TABLET DAILY 90 tablet 3  . bimatoprost (LUMIGAN) 0.03 % ophthalmic solution Place 1 drop into both eyes at bedtime.    . carvedilol (COREG) 6.25 MG tablet Take 1 tablet (6.25 mg total) by mouth daily. 90 tablet 1  . cholecalciferol (VITAMIN D) 400 UNITS TABS Take 2,000 Units by mouth daily.     . clopidogrel (PLAVIX) 75 MG tablet TAKE 1 TABLET DAILY WITH BREAKFAST 90 tablet 0  . Coenzyme Q10 (CO Q-10) 100 MG CAPS Take 300 mg by mouth daily.    . Cyanocobalamin (VITAMIN B-12 CR) 1500 MCG TBCR Take by mouth daily.    . finasteride (PROSCAR) 5 MG tablet Take 5 mg by mouth daily.    Marland Kitchen HEMOCYTE-F 324-1 MG TABS TAKE 1 TABLET DAILY 90 each 0  . isosorbide mononitrate (IMDUR) 30 MG 24 hr tablet Take 1 tablet (30 mg total) by mouth daily. 90 tablet 2  . lisinopril (PRINIVIL,ZESTRIL) 5 MG tablet TAKE 2 TABLETS TWICE A DAY 360 tablet 0  . Misc Natural Products (OSTEO BI-FLEX JOINT SHIELD PO) Take 2 tablets by mouth daily.     . Multiple Vitamin (MULTIVITAMIN) capsule Take 1 capsule by mouth daily.    Marland Kitchen  NAMENDA 10 MG tablet TAKE 1 TABLET TWICE A DAY 180 tablet 0  . Omega-3 Fatty Acids (FP FISH OIL PO) Take 2,400 mg by mouth daily.     . ondansetron (ZOFRAN ODT) 4 MG disintegrating tablet 4mg  ODT q4 hours prn nausea/vomit (Patient not taking: Reported on 02/19/2015) 10 tablet 0  . rivastigmine (EXELON) 9.5 mg/24hr Place 1 patch onto the skin daily.    . timolol (BETIMOL) 0.25 % ophthalmic solution Place 1-2 drops into both eyes 2 (two) times daily.    . vitamin C (ASCORBIC ACID) 500 MG tablet Take 500 mg by mouth daily.     No current facility-administered medications on file prior to visit.    Allergies  Allergen Reactions  . Niacin And Related Nausea And Vomiting  . Wellbutrin [Bupropion] Hives    Gi upset    Review of Systems  Review of Systems  Constitutional: Negative for fever and malaise/fatigue.  HENT: Negative for congestion.   Eyes: Negative  for discharge.  Respiratory: Negative for shortness of breath.   Cardiovascular: Negative for chest pain, palpitations and leg swelling.  Gastrointestinal: Negative for nausea, abdominal pain and diarrhea.  Genitourinary: Negative for dysuria.  Musculoskeletal: Negative for falls.  Skin: Negative for rash.  Neurological: Negative for loss of consciousness and headaches.  Endo/Heme/Allergies: Negative for polydipsia.  Psychiatric/Behavioral: Positive for depression and memory loss. Negative for suicidal ideas. The patient is nervous/anxious. The patient does not have insomnia.     Objective  BP 132/72 mmHg  Pulse 57  Temp(Src) 97.5 F (36.4 C) (Oral)  Ht 5\' 10"  (1.778 m)  Wt 178 lb 8 oz (80.967 kg)  BMI 25.61 kg/m2  SpO2 97%  Physical Exam  Physical Exam  Constitutional: He is oriented to person, place, and time and well-developed, well-nourished, and in no distress. No distress.  HENT:  Head: Normocephalic and atraumatic.  Eyes: Conjunctivae are normal.  Neck: Neck supple. No thyromegaly present.  Cardiovascular: Normal rate, regular rhythm and normal heart sounds.   Pulmonary/Chest: Effort normal and breath sounds normal. No respiratory distress.  Abdominal: He exhibits no distension and no mass. There is no tenderness.  Musculoskeletal: He exhibits no edema.  Neurological: He is alert and oriented to person, place, and time.  Skin: Skin is warm.  Psychiatric: Memory, affect and judgment normal.    Lab Results  Component Value Date   TSH 1.197 02/19/2015   Lab Results  Component Value Date   WBC 7.4 02/19/2015   HGB 12.8* 02/19/2015   HCT 37.6* 02/19/2015   MCV 89.5 02/19/2015   PLT 252 02/19/2015   Lab Results  Component Value Date   CREATININE 0.75 02/19/2015   BUN 15 02/19/2015   NA 140 02/19/2015   K 4.4 02/19/2015   CL 104 02/19/2015   CO2 26 02/19/2015   Lab Results  Component Value Date   ALT 23 02/19/2015   AST 19 02/19/2015   ALKPHOS 68  02/19/2015   BILITOT 0.9 02/19/2015   Lab Results  Component Value Date   CHOL 89 02/19/2015   Lab Results  Component Value Date   HDL 39* 02/19/2015   Lab Results  Component Value Date   LDLCALC 39 02/19/2015   Lab Results  Component Value Date   TRIG 53 02/19/2015   Lab Results  Component Value Date   CHOLHDL 2.3 02/19/2015     Assessment & Plan  Hyperlipidemia Tolerating statin, encouraged heart healthy diet, avoid trans fats, minimize simple carbs and  saturated fats. Increase exercise as tolerated   Hyperglycemia  minimize simple carbs. Increase exercise as tolerated.    Hypertension Well controlled, no changes to meds. Encouraged heart healthy diet such as the DASH diet and exercise as tolerated.    Dementia Only takes his meds intermittently and the family is not sure they are helping, they will decide whether to continue the medicaitons.   Anemia Mild, Increase leafy greens, consider increased lean red meat and using cast iron cookware. Continue to monitor, report any concerns

## 2015-03-04 NOTE — Assessment & Plan Note (Signed)
minimize simple carbs. Increase exercise as tolerated.  

## 2015-03-04 NOTE — Assessment & Plan Note (Signed)
Tolerating statin, encouraged heart healthy diet, avoid trans fats, minimize simple carbs and saturated fats. Increase exercise as tolerated 

## 2015-03-04 NOTE — Assessment & Plan Note (Signed)
Well controlled, no changes to meds. Encouraged heart healthy diet such as the DASH diet and exercise as tolerated.  °

## 2015-03-04 NOTE — Assessment & Plan Note (Signed)
Only takes his meds intermittently and the family is not sure they are helping, they will decide whether to continue the medicaitons.

## 2015-03-04 NOTE — Assessment & Plan Note (Signed)
Mild, Increase leafy greens, consider increased lean red meat and using cast iron cookware. Continue to monitor, report any concerns 

## 2015-03-09 ENCOUNTER — Other Ambulatory Visit: Payer: Self-pay | Admitting: Family Medicine

## 2015-03-16 ENCOUNTER — Telehealth: Payer: Self-pay | Admitting: Cardiology

## 2015-03-16 NOTE — Telephone Encounter (Signed)
New message     Pt c/o medication issue:  1. Name of Medication: amlodipine 2. How are you currently taking this medication (dosage and times per day)? 5mg  3. Are you having a reaction (difficulty breathing--STAT)? no  4. What is your medication issue? Dr Stanford Breed said stop one of the bp medications.  Is this the one to stop?  Please call after 1:30

## 2015-03-16 NOTE — Telephone Encounter (Signed)
Returned call to Bethena Roys (spouse). Per OV in jan 2016 patient was to STOP amlodipine. This information was confirmed with spouse   Patient Instructions     Your physician wants you to follow-up in: [place] will receive a reminder letter in the mail two months in advance. If you don't receive a letter, please call our office to schedule the follow-up appointment.   STOP AMLODIPINE

## 2015-03-17 ENCOUNTER — Other Ambulatory Visit: Payer: Self-pay | Admitting: Family Medicine

## 2015-03-29 ENCOUNTER — Telehealth: Payer: Self-pay | Admitting: Family Medicine

## 2015-03-29 NOTE — Telephone Encounter (Signed)
Caller name:Egan,Debra Relation to pt: daughter  Call back number: [phone]   Reason for call:  [facility] provides shingle vaccination to residents at  [facility] and daughter is requesting a RX for shingles please indicate on RX "[facility] resident" fax to fax [phone]

## 2015-04-30 ENCOUNTER — Other Ambulatory Visit (INDEPENDENT_AMBULATORY_CARE_PROVIDER_SITE_OTHER): Payer: Medicare Other

## 2015-04-30 ENCOUNTER — Telehealth: Payer: Self-pay | Admitting: Family Medicine

## 2015-04-30 DIAGNOSIS — D509 Iron deficiency anemia, unspecified: Secondary | ICD-10-CM | POA: Diagnosis not present

## 2015-04-30 LAB — CBC
HCT: 40 % (ref 39.0–52.0)
Hemoglobin: 13.2 g/dL (ref 13.0–17.0)
MCHC: 33 g/dL (ref 30.0–36.0)
MCV: 91.9 fl (ref 78.0–100.0)
Platelets: 219 10*3/uL (ref 150.0–400.0)
RBC: 4.35 Mil/uL (ref 4.22–5.81)
RDW: 12.8 % (ref 11.5–15.5)
WBC: 9.5 10*3/uL (ref 4.0–10.5)

## 2015-04-30 MED ORDER — ZOSTER VACCINE LIVE 19400 UNT/0.65ML ~~LOC~~ SOLR: 0.6500 mL | Freq: Once | SUBCUTANEOUS | Status: DC

## 2015-04-30 NOTE — Telephone Encounter (Signed)
OK to give rx for Zostavax for pharmacy

## 2015-04-30 NOTE — Telephone Encounter (Signed)
Caller name: Brax Relation to pt: self  Call back number: [phone] Pharmacy: [facility] [address] [place], [place] [address] Phone [phone] or [phone]  Reason for call: Pt is requesting rx for Shingles shot to be sent to [facility]. Please advise.

## 2015-04-30 NOTE — Telephone Encounter (Signed)
Sent in and daughter informed

## 2015-05-10 ENCOUNTER — Other Ambulatory Visit: Payer: Self-pay | Admitting: Family Medicine

## 2015-06-16 ENCOUNTER — Telehealth: Payer: Self-pay | Admitting: Family Medicine

## 2015-06-16 NOTE — Telephone Encounter (Signed)
Caller name: Gregorey Nabor Relationship to patient: wife Can be reached: [phone] Pharmacy: [facility]  Reason for call: Pt needing refill on Hemocyte-F. He has 15 left. Please send 3 month supply to mail order pharmacy. Takes 1/day.

## 2015-06-17 MED ORDER — FERROUS FUMARATE-FOLIC ACID 324-1 MG PO TABS: 1.0000 | ORAL_TABLET | Freq: Every day | ORAL | Status: DC

## 2015-07-19 ENCOUNTER — Telehealth: Payer: Self-pay | Admitting: Family Medicine

## 2015-07-19 MED ORDER — FERROUS FUMARATE-FOLIC ACID 324-1 MG PO TABS: 1.0000 | ORAL_TABLET | Freq: Every day | ORAL | Status: DC

## 2015-07-19 NOTE — Telephone Encounter (Signed)
Relation to pt: Samuel Meyers  Call back number: [phone]   Reason for call:  Patient requesting a refill Ferrous Fumarate-Folic Acid (HEMOCYTE-F) 324-1 MG TABS

## 2015-08-04 ENCOUNTER — Other Ambulatory Visit: Payer: Self-pay | Admitting: Family Medicine

## 2015-08-04 NOTE — Telephone Encounter (Signed)
LOV and Labs 02/19/15 Isosorbide monotrate QD 90 2rf

## 2015-08-06 ENCOUNTER — Other Ambulatory Visit: Payer: Self-pay | Admitting: Family Medicine

## 2015-08-26 ENCOUNTER — Ambulatory Visit (INDEPENDENT_AMBULATORY_CARE_PROVIDER_SITE_OTHER): Payer: Medicare Other | Admitting: Pulmonary Disease

## 2015-08-26 ENCOUNTER — Encounter: Payer: Self-pay | Admitting: Pulmonary Disease

## 2015-08-26 VITALS — BP 135/72 | HR 61 | Ht 70.0 in | Wt 177.0 lb

## 2015-08-26 DIAGNOSIS — I2510 Atherosclerotic heart disease of native coronary artery without angina pectoris: Secondary | ICD-10-CM

## 2015-08-26 DIAGNOSIS — G4733 Obstructive sleep apnea (adult) (pediatric): Secondary | ICD-10-CM | POA: Diagnosis not present

## 2015-08-26 NOTE — Assessment & Plan Note (Signed)
Get back on CPAP machine Avg pr 12 cm Supplies will be renewed x 1 yr   compliance with goal of at least 4-6 hrs every night is the expectation. Advised against medications with sedative side effects He does not drive

## 2015-08-26 NOTE — Progress Notes (Signed)
   Subjective:    Patient ID: Samuel Meyers, male    DOB: February 14, 1934, 81 y.o.   MRN: [id number]  HPI  81 year old for FU of OSA  He  [place]oved [place] He has hypertension, hyperlipidemia, coronary artery disease status post CABG and stroke with residual aphasia.  Sleep study in 08/2009 showed moderate OSA with AHI 16/h, RDI 24/h with desatn to 84%. Most of the events occurred while supine. Positional therapy was tried initially  After a cpap titration study (required upto 16 cm ) he was placed on Autoset 8-16 cm [facility])    08/26/2015  Chief Complaint  Patient presents with  . Sleep Apnea    Patient has not been using CPAP much, he has Dementia and forgets to put the CPAP on.  patient has been sleeping okay.     - remains on auto settings 8-16 He accidentally fell down & stopped using CPAP for last 3 wks  Denies any problems the mask or machine.   better rested when used it, mask ok, pressure ok, no dryness  Memory seems worse, still at [facility]  download 04/2012 shows good compliance > 8h, avg pr 12 cm, no residuals Download 08/2015 >> avg pr 12, no leak, no residuals  Review of Systems neg for any significant sore throat, dysphagia, itching, sneezing, nasal congestion or excess/ purulent secretions, fever, chills, sweats, unintended wt loss, pleuritic or exertional cp, hempoptysis, orthopnea pnd or change in chronic leg swelling. Also denies presyncope, palpitations, heartburn, abdominal pain, nausea, vomiting, diarrhea or change in bowel or urinary habits, dysuria,hematuria, rash, arthralgias, visual complaints, headache, numbness weakness or ataxia.     Objective:   Physical Exam  Gen. Pleasant, well-nourished, in no distress ENT - no lesions, no post nasal drip Neck: No JVD, no thyromegaly, no carotid bruits Lungs: no use of accessory muscles, no dullness to percussion, clear without rales or rhonchi  Cardiovascular: Rhythm regular, heart sounds   normal, no murmurs or gallops, no peripheral edema Musculoskeletal: No deformities, no cyanosis or clubbing         Assessment & Plan:

## 2015-08-26 NOTE — Patient Instructions (Signed)
Get back on CPAP machine Set at 12 cm Call as needed

## 2015-08-30 ENCOUNTER — Telehealth: Payer: Self-pay | Admitting: *Deleted

## 2015-08-30 ENCOUNTER — Encounter: Payer: Self-pay | Admitting: *Deleted

## 2015-08-30 NOTE — Telephone Encounter (Signed)
Pre-Visit Call completed with wife and chart updated.   Pre-Visit Info documented in Specialty Comments under [employer].    

## 2015-08-31 ENCOUNTER — Ambulatory Visit (INDEPENDENT_AMBULATORY_CARE_PROVIDER_SITE_OTHER): Payer: Medicare Other | Admitting: Family Medicine

## 2015-08-31 ENCOUNTER — Encounter: Payer: Self-pay | Admitting: Family Medicine

## 2015-08-31 VITALS — BP 122/66 | HR 71 | Temp 98.0°F | Ht 72.0 in | Wt 175.5 lb

## 2015-08-31 DIAGNOSIS — I10 Essential (primary) hypertension: Secondary | ICD-10-CM | POA: Diagnosis not present

## 2015-08-31 DIAGNOSIS — I2510 Atherosclerotic heart disease of native coronary artery without angina pectoris: Secondary | ICD-10-CM | POA: Diagnosis not present

## 2015-08-31 DIAGNOSIS — E785 Hyperlipidemia, unspecified: Secondary | ICD-10-CM | POA: Diagnosis not present

## 2015-08-31 DIAGNOSIS — Z23 Encounter for immunization: Secondary | ICD-10-CM | POA: Diagnosis not present

## 2015-08-31 DIAGNOSIS — D649 Anemia, unspecified: Secondary | ICD-10-CM | POA: Diagnosis not present

## 2015-08-31 DIAGNOSIS — F0391 Unspecified dementia with behavioral disturbance: Secondary | ICD-10-CM | POA: Diagnosis not present

## 2015-08-31 DIAGNOSIS — Z0000 Encounter for general adult medical examination without abnormal findings: Secondary | ICD-10-CM | POA: Diagnosis not present

## 2015-08-31 LAB — COMPREHENSIVE METABOLIC PANEL
ALBUMIN: 4.4 g/dL (ref 3.5–5.2)
ALK PHOS: 65 U/L (ref 39–117)
ALT: 30 U/L (ref 0–53)
AST: 24 U/L (ref 0–37)
BILIRUBIN TOTAL: 1 mg/dL (ref 0.2–1.2)
BUN: 16 mg/dL (ref 6–23)
CALCIUM: 9.4 mg/dL (ref 8.4–10.5)
CO2: 32 meq/L (ref 19–32)
Chloride: 105 mEq/L (ref 96–112)
Creatinine, Ser: 0.81 mg/dL (ref 0.40–1.50)
GFR: 97.62 mL/min (ref >60.00)
Glucose, Bld: 105 mg/dL — ABNORMAL HIGH (ref 70–99)
Potassium: 4.7 mEq/L (ref 3.5–5.1)
Sodium: 142 mEq/L (ref 135–145)
Total Protein: 7.4 g/dL (ref 6.0–8.3)

## 2015-08-31 LAB — LIPID PANEL
CHOL/HDL RATIO: 2
CHOLESTEROL: 91 mg/dL (ref 0–200)
HDL: 44 mg/dL (ref >39.00)
LDL Cholesterol: 34 mg/dL (ref 0–99)
NonHDL: 47.09
TRIGLYCERIDES: 63 mg/dL (ref 0.0–149.0)
VLDL: 12.6 mg/dL (ref 0.0–40.0)

## 2015-08-31 LAB — CBC
HCT: 40.9 % (ref 39.0–52.0)
HEMOGLOBIN: 13.4 g/dL (ref 13.0–17.0)
MCHC: 32.9 g/dL (ref 30.0–36.0)
MCV: 92.5 fl (ref 78.0–100.0)
PLATELETS: 215 10*3/uL (ref 150.0–400.0)
RBC: 4.42 Mil/uL (ref 4.22–5.81)
RDW: 13.3 % (ref 11.5–15.5)
WBC: 7.6 10*3/uL (ref 4.0–10.5)

## 2015-08-31 LAB — TSH: TSH: 1.04 u[IU]/mL (ref 0.35–4.50)

## 2015-08-31 MED ORDER — LISINOPRIL 10 MG PO TABS: 10.0000 mg | ORAL_TABLET | Freq: Every day | ORAL | Status: DC

## 2015-08-31 NOTE — Patient Instructions (Addendum)
Speak with daughter about seeing an attorney for Power of Attorney Lisinopril take two (2) 74m tablets in the morning and (2) 550mtablets in the evening until 10 mg tablets arrive   Preventive Care for Adults A healthy lifestyle and preventive care can promote health and wellness. Preventive health guidelines for men include the following key practices:  A routine yearly physical is a good way to check with your health care provider about your health and preventative screening. It is a chance to share any concerns and updates on your health and to receive a thorough exam.  Visit your dentist for a routine exam and preventative care every 6 months. Brush your teeth twice a day and floss once a day. Good oral hygiene prevents tooth decay and gum disease.  The frequency of eye exams is based on your age, health, family medical history, use of contact lenses, and other factors. Follow your health care provider's recommendations for frequency of eye exams.  Eat a healthy diet. Foods such as vegetables, fruits, whole grains, low-fat dairy products, and lean protein foods contain the nutrients you need without too many calories. Decrease your intake of foods high in solid fats, added sugars, and salt. Eat the right amount of calories for you.Get information about a proper diet from your health care provider, if necessary.  Regular physical exercise is one of the most important things you can do for your health. Most adults should get at least 150 minutes of moderate-intensity exercise (any activity that increases your heart rate and causes you to sweat) each week. In addition, most adults need muscle-strengthening exercises on 2 or more days a week.  Maintain a healthy weight. The body mass index (BMI) is a screening tool to identify possible weight problems. It provides an estimate of body fat based on height and weight. Your health care provider can find your BMI and can help you achieve or maintain a  healthy weight.For adults 20 years and older:  A BMI below 18.5 is considered underweight.  A BMI of 18.5 to 24.9 is normal.  A BMI of 25 to 29.9 is considered overweight.  A BMI of 30 and above is considered obese.  Maintain normal blood lipids and cholesterol levels by exercising and minimizing your intake of saturated fat. Eat a balanced diet with plenty of fruit and vegetables. Blood tests for lipids and cholesterol should begin at age 5464nd be repeated every 5 years. If your lipid or cholesterol levels are high, you are over 50, or you are at high risk for heart disease, you may need your cholesterol levels checked more frequently.Ongoing high lipid and cholesterol levels should be treated with medicines if diet and exercise are not working.  If you smoke, find out from your health care provider how to quit. If you do not use tobacco, do not start.  Lung cancer screening is recommended for adults aged 79-80ears who are at high risk for developing lung cancer because of a history of smoking. A yearly low-dose CT scan of the lungs is recommended for people who have at least a 30-pack-year history of smoking and are a current smoker or have quit within the past 15 years. A pack year of smoking is smoking an average of 1 pack of cigarettes a day for 1 year (for example: 1 pack a day for 30 years or 2 packs a day for 15 years). Yearly screening should continue until the smoker has stopped smoking for at least 15  years. Yearly screening should be stopped for people who develop a health problem that would prevent them from having lung cancer treatment.  If you choose to drink alcohol, do not have more than 2 drinks per day. One drink is considered to be 12 ounces (355 mL) of beer, 5 ounces (148 mL) of wine, or 1.5 ounces (44 mL) of liquor.  Avoid use of street drugs. Do not share needles with anyone. Ask for help if you need support or instructions about stopping the use of drugs.  High blood  pressure causes heart disease and increases the risk of stroke. Your blood pressure should be checked at least every 1-2 years. Ongoing high blood pressure should be treated with medicines, if weight loss and exercise are not effective.  If you are 84-59 years old, ask your health care provider if you should take aspirin to prevent heart disease.  Diabetes screening involves taking a blood sample to check your fasting blood sugar level. This should be done once every 3 years, after age 82, if you are within normal weight and without risk factors for diabetes. Testing should be considered at a younger age or be carried out more frequently if you are overweight and have at least 1 risk factor for diabetes.  Colorectal cancer can be detected and often prevented. Most routine colorectal cancer screening begins at the age of 12 and continues through age 39. However, your health care provider may recommend screening at an earlier age if you have risk factors for colon cancer. On a yearly basis, your health care provider may provide home test kits to check for hidden blood in the stool. Use of a small camera at the end of a tube to directly examine the colon (sigmoidoscopy or colonoscopy) can detect the earliest forms of colorectal cancer. Talk to your health care provider about this at age 75, when routine screening begins. Direct exam of the colon should be repeated every 5-10 years through age 35, unless early forms of precancerous polyps or small growths are found.  People who are at an increased risk for hepatitis B should be screened for this virus. You are considered at high risk for hepatitis B if:  You were born in a country where hepatitis B occurs often. Talk with your health care provider about which countries are considered high risk.  Your parents were born in a high-risk country and you have not received a shot to protect against hepatitis B (hepatitis B vaccine).  You have HIV or AIDS.  You  use needles to inject street drugs.  You live with, or have sex with, someone who has hepatitis B.  You are a man who has sex with other men (MSM).  You get hemodialysis treatment.  You take certain medicines for conditions such as cancer, organ transplantation, and autoimmune conditions.  Hepatitis C blood testing is recommended for all people born from [birth year] through 1965 and any individual with known risks for hepatitis C.  Practice safe sex. Use condoms and avoid high-risk sexual practices to reduce the spread of sexually transmitted infections (STIs). STIs include gonorrhea, chlamydia, syphilis, trichomonas, herpes, HPV, and human immunodeficiency virus (HIV). Herpes, HIV, and HPV are viral illnesses that have no cure. They can result in disability, cancer, and death.  If you are at risk of being infected with HIV, it is recommended that you take a prescription medicine daily to prevent HIV infection. This is called preexposure prophylaxis (PrEP). You are considered at risk if:  You are a man who has sex with other men (MSM) and have other risk factors.  You are a heterosexual man, are sexually active, and are at increased risk for HIV infection.  You take drugs by injection.  You are sexually active with a partner who has HIV.  Talk with your health care provider about whether you are at high risk of being infected with HIV. If you choose to begin PrEP, you should first be tested for HIV. You should then be tested every 3 months for as long as you are taking PrEP.  A one-time screening for abdominal aortic aneurysm (AAA) and surgical repair of large AAAs by ultrasound are recommended for men ages 57 to 59 years who are current or former smokers.  Healthy men should no longer receive prostate-specific antigen (PSA) blood tests as part of routine cancer screening. Talk with your health care provider about prostate cancer screening.  Testicular cancer screening is not recommended for  adult males who have no symptoms. Screening includes self-exam, a health care provider exam, and other screening tests. Consult with your health care provider about any symptoms you have or any concerns you have about testicular cancer.  Use sunscreen. Apply sunscreen liberally and repeatedly throughout the day. You should seek shade when your shadow is shorter than you. Protect yourself by wearing long sleeves, pants, a wide-brimmed hat, and sunglasses year round, whenever you are outdoors.  Once a month, do a whole-body skin exam, using a mirror to look at the skin on your back. Tell your health care provider about new moles, moles that have irregular borders, moles that are larger than a pencil eraser, or moles that have changed in shape or color.  Stay current with required vaccines (immunizations).  Influenza vaccine. All adults should be immunized every year.  Tetanus, diphtheria, and acellular pertussis (Td, Tdap) vaccine. An adult who has not previously received Tdap or who does not know his vaccine status should receive 1 dose of Tdap. This initial dose should be followed by tetanus and diphtheria toxoids (Td) booster doses every 10 years. Adults with an unknown or incomplete history of completing a 3-dose immunization series with Td-containing vaccines should begin or complete a primary immunization series including a Tdap dose. Adults should receive a Td booster every 10 years.  Varicella vaccine. An adult without evidence of immunity to varicella should receive 2 doses or a second dose if he has previously received 1 dose.  Human papillomavirus (HPV) vaccine. Males aged 22-21 years who have not received the vaccine previously should receive the 3-dose series. Males aged 22-26 years may be immunized. Immunization is recommended through the age of 20 years for any male who has sex with males and did not get any or all doses earlier. Immunization is recommended for any person with an  immunocompromised condition through the age of 9 years if he did not get any or all doses earlier. During the 3-dose series, the second dose should be obtained 4-8 weeks after the first dose. The third dose should be obtained 24 weeks after the first dose and 16 weeks after the second dose.  Zoster vaccine. One dose is recommended for adults aged 32 years or older unless certain conditions are present.  Measles, mumps, and rubella (MMR) vaccine. Adults born before [birth year] generally are considered immune to measles and mumps. Adults born in [birth year] or later should have 1 or more doses of MMR vaccine unless there is a contraindication to the vaccine or  there is laboratory evidence of immunity to each of the three diseases. A routine second dose of MMR vaccine should be obtained at least 28 days after the first dose for students attending postsecondary schools, health care workers, or international travelers. People who received inactivated measles vaccine or an unknown type of measles vaccine during 1963-1967 should receive 2 doses of MMR vaccine. People who received inactivated mumps vaccine or an unknown type of mumps vaccine before 1979 and are at high risk for mumps infection should consider immunization with 2 doses of MMR vaccine. Unvaccinated health care workers born before [birth year] who lack laboratory evidence of measles, mumps, or rubella immunity or laboratory confirmation of disease should consider measles and mumps immunization with 2 doses of MMR vaccine or rubella immunization with 1 dose of MMR vaccine.  Pneumococcal 13-valent conjugate (PCV13) vaccine. When indicated, a person who is uncertain of his immunization history and has no record of immunization should receive the PCV13 vaccine. An adult aged 43 years or older who has certain medical conditions and has not been previously immunized should receive 1 dose of PCV13 vaccine. This PCV13 should be followed with a dose of pneumococcal polysaccharide  (PPSV23) vaccine. The PPSV23 vaccine dose should be obtained at least 8 weeks after the dose of PCV13 vaccine. An adult aged 66 years or older who has certain medical conditions and previously received 1 or more doses of PPSV23 vaccine should receive 1 dose of PCV13. The PCV13 vaccine dose should be obtained 1 or more years after the last PPSV23 vaccine dose.  Pneumococcal polysaccharide (PPSV23) vaccine. When PCV13 is also indicated, PCV13 should be obtained first. All adults aged 3 years and older should be immunized. An adult younger than age 61 years who has certain medical conditions should be immunized. Any person who resides in a nursing home or long-term care facility should be immunized. An adult smoker should be immunized. People with an immunocompromised condition and certain other conditions should receive both PCV13 and PPSV23 vaccines. People with human immunodeficiency virus (HIV) infection should be immunized as soon as possible after diagnosis. Immunization during chemotherapy or radiation therapy should be avoided. Routine use of PPSV23 vaccine is not recommended for American Indians, [place] Natives, or people younger than 65 years unless there are medical conditions that require PPSV23 vaccine. When indicated, people who have unknown immunization and have no record of immunization should receive PPSV23 vaccine. One-time revaccination 5 years after the first dose of PPSV23 is recommended for people aged 19-64 years who have chronic kidney failure, nephrotic syndrome, asplenia, or immunocompromised conditions. People who received 1-2 doses of PPSV23 before age 87 years should receive another dose of PPSV23 vaccine at age 63 years or later if at least 5 years have passed since the previous dose. Doses of PPSV23 are not needed for people immunized with PPSV23 at or after age 30 years.  Meningococcal vaccine. Adults with asplenia or persistent complement component deficiencies should receive 2  doses of quadrivalent meningococcal conjugate (MenACWY-D) vaccine. The doses should be obtained at least 2 months apart. Microbiologists working with certain meningococcal bacteria, [employer] recruits, people at risk during an outbreak, and people who travel to or live in countries with a high rate of meningitis should be immunized. A first-year college student up through age 36 years who is living in a residence hall should receive a dose if he did not receive a dose on or after his 16th birthday. Adults who have certain high-risk conditions should receive one  or more doses of vaccine.  Hepatitis A vaccine. Adults who wish to be protected from this disease, have certain high-risk conditions, work with hepatitis A-infected animals, work in hepatitis A research labs, or travel to or work in countries with a high rate of hepatitis A should be immunized. Adults who were previously unvaccinated and who anticipate close contact with an international adoptee during the first 60 days after arrival in the [place] from a country with a high rate of hepatitis A should be immunized.  Hepatitis B vaccine. Adults should be immunized if they wish to be protected from this disease, have certain high-risk conditions, may be exposed to blood or other infectious body fluids, are household contacts or sex partners of hepatitis B positive people, are clients or workers in certain care facilities, or travel to or work in countries with a high rate of hepatitis B.  Haemophilus influenzae type b (Hib) vaccine. A previously unvaccinated person with asplenia or sickle cell disease or having a scheduled splenectomy should receive 1 dose of Hib vaccine. Regardless of previous immunization, a recipient of a hematopoietic stem cell transplant should receive a 3-dose series 6-12 months after his successful transplant. Hib vaccine is not recommended for adults with HIV infection. Preventive Service / Frequency Ages 26 to 59  Blood  pressure check.** / Every 1 to 2 years.  Lipid and cholesterol check.** / Every 5 years beginning at age 91.  Hepatitis C blood test.** / For any individual with known risks for hepatitis C.  Skin self-exam. / Monthly.  Influenza vaccine. / Every year.  Tetanus, diphtheria, and acellular pertussis (Tdap, Td) vaccine.** / Consult your health care provider. 1 dose of Td every 10 years.  Varicella vaccine.** / Consult your health care provider.  HPV vaccine. / 3 doses over 6 months, if 32 or younger.  Measles, mumps, rubella (MMR) vaccine.** / You need at least 1 dose of MMR if you were born in 1957 or later. You may also need a second dose.  Pneumococcal 13-valent conjugate (PCV13) vaccine.** / Consult your health care provider.  Pneumococcal polysaccharide (PPSV23) vaccine.** / 1 to 2 doses if you smoke cigarettes or if you have certain conditions.  Meningococcal vaccine.** / 1 dose if you are age 52 to 17 years and a Market researcher living in a residence hall, or have one of several medical conditions. You may also need additional booster doses.  Hepatitis A vaccine.** / Consult your health care provider.  Hepatitis B vaccine.** / Consult your health care provider.  Haemophilus influenzae type b (Hib) vaccine.** / Consult your health care provider. Ages 56 to 29  Blood pressure check.** / Every 1 to 2 years.  Lipid and cholesterol check.** / Every 5 years beginning at age 27.  Lung cancer screening. / Every year if you are aged 77-80 years and have a 30-pack-year history of smoking and currently smoke or have quit within the past 15 years. Yearly screening is stopped once you have quit smoking for at least 15 years or develop a health problem that would prevent you from having lung cancer treatment.  Fecal occult blood test (FOBT) of stool. / Every year beginning at age 37 and continuing until age 87. You may not have to do this test if you get a colonoscopy every 10  years.  Flexible sigmoidoscopy** or colonoscopy.** / Every 5 years for a flexible sigmoidoscopy or every 10 years for a colonoscopy beginning at age 60 and continuing until age  75.  Hepatitis C blood test.** / For all people born from [birth year] through 1965 and any individual with known risks for hepatitis C.  Skin self-exam. / Monthly.  Influenza vaccine. / Every year.  Tetanus, diphtheria, and acellular pertussis (Tdap/Td) vaccine.** / Consult your health care provider. 1 dose of Td every 10 years.  Varicella vaccine.** / Consult your health care provider.  Zoster vaccine.** / 1 dose for adults aged 76 years or older.  Measles, mumps, rubella (MMR) vaccine.** / You need at least 1 dose of MMR if you were born in 1957 or later. You may also need a second dose.  Pneumococcal 13-valent conjugate (PCV13) vaccine.** / Consult your health care provider.  Pneumococcal polysaccharide (PPSV23) vaccine.** / 1 to 2 doses if you smoke cigarettes or if you have certain conditions.  Meningococcal vaccine.** / Consult your health care provider.  Hepatitis A vaccine.** / Consult your health care provider.  Hepatitis B vaccine.** / Consult your health care provider.  Haemophilus influenzae type b (Hib) vaccine.** / Consult your health care provider. Ages 74 and over  Blood pressure check.** / Every 1 to 2 years.  Lipid and cholesterol check.**/ Every 5 years beginning at age 55.  Lung cancer screening. / Every year if you are aged 60-80 years and have a 30-pack-year history of smoking and currently smoke or have quit within the past 15 years. Yearly screening is stopped once you have quit smoking for at least 15 years or develop a health problem that would prevent you from having lung cancer treatment.  Fecal occult blood test (FOBT) of stool. / Every year beginning at age 21 and continuing until age 36. You may not have to do this test if you get a colonoscopy every 10 years.  Flexible  sigmoidoscopy** or colonoscopy.** / Every 5 years for a flexible sigmoidoscopy or every 10 years for a colonoscopy beginning at age 70 and continuing until age 5.  Hepatitis C blood test.** / For all people born from [birth year] through 1963 and any individual with known risks for hepatitis C.  Abdominal aortic aneurysm (AAA) screening.** / A one-time screening for ages 89 to 27 years who are current or former smokers.  Skin self-exam. / Monthly.  Influenza vaccine. / Every year.  Tetanus, diphtheria, and acellular pertussis (Tdap/Td) vaccine.** / 1 dose of Td every 10 years.  Varicella vaccine.** / Consult your health care provider.  Zoster vaccine.** / 1 dose for adults aged 58 years or older.  Pneumococcal 13-valent conjugate (PCV13) vaccine.** / Consult your health care provider.  Pneumococcal polysaccharide (PPSV23) vaccine.** / 1 dose for all adults aged 43 years and older.  Meningococcal vaccine.** / Consult your health care provider.  Hepatitis A vaccine.** / Consult your health care provider.  Hepatitis B vaccine.** / Consult your health care provider.  Haemophilus influenzae type b (Hib) vaccine.** / Consult your health care provider. **Family history and personal history of risk and conditions may change your health care provider's recommendations. Document Released: 01/16/2002 Document Revised: 11/25/2013 Document Reviewed: 04/17/2011 [facility] Patient Information 2015 [place], [place]. This information is not intended to replace advice given to you by your health care provider. Make sure you discuss any questions you have with your health care provider.

## 2015-08-31 NOTE — Progress Notes (Signed)
Pre visit review using our clinic review tool, if applicable. No additional management support is needed unless otherwise documented below in the visit note. 

## 2015-08-31 NOTE — Progress Notes (Signed)
Patient ID: Samuel Meyers, male   DOB: [birth year], 81 y.o.   MRN: [id number]   Subjective:    Patient ID: Samuel Meyers, male    DOB: [birth year], 81 y.o.   MRN: [id number]  Chief Complaint  Patient presents with  . Medicare Wellness    HPI Patient is in today for annual exam and follow up on numerous concerns. They continue to live at [facility]. His memory continues to decline and he is beginning to think he lives elsewhere, that it is a diferrent year etc. Generally his wife who accompanies him today can handle him well. No recent acute illness or hospitalization. He continues to have a good appetite and feed himself well. Denies CP/palp/SOB/HA/congestion/fevers/GI or GU c/o. Taking meds as prescribed  Past Medical History  Diagnosis Date  . Hypertension   . Hyperlipidemia   . CAD (coronary artery disease)   . Pulmonary embolus     Time of CABG  . Dementia   . Depression   . Stroke     2013--Sept  . Gastroenteritis 10/20/2013  . Anemia 03/01/2014  . Prostate cancer   . Renal cyst, left 08/27/2014    5x6 cm on Ultrasound September 2015    Past Surgical History  Procedure Laterality Date  . Coronary artery bypass graft      [place]  . External ear surgery    . Tonsillectomy    . Tee without cardioversion  08/15/2012    Procedure: TRANSESOPHAGEAL ECHOCARDIOGRAM (TEE);  Surgeon: Fay Records, MD;  Location: [facility] ENDOSCOPY;  Service: Cardiovascular;  Laterality: N/A;    Family History  Problem Relation Age of Onset  . Coronary artery disease Father     MI in his 77s    Social History   Social History  . Marital Status: Married    Spouse Name: N/A  . Number of Children: 1  . Years of Education: N/A   Occupational History  . security     Retired   Social History Main Topics  . Smoking status: Never Smoker   . Smokeless tobacco: Never Used  . Alcohol Use: No  . Drug Use: No  . Sexual Activity: Not on file   Other Topics Concern  . Not on file    Social History Narrative    Outpatient Prescriptions Prior to Visit  Medication Sig Dispense Refill  . acetaminophen (TYLENOL) 650 MG CR tablet Take 1,300 mg by mouth daily.    Marland Kitchen atorvastatin (LIPITOR) 20 MG tablet TAKE 1 TABLET DAILY 90 tablet 3  . bimatoprost (LUMIGAN) 0.03 % ophthalmic solution Place 1 drop into both eyes at bedtime.    . carvedilol (COREG) 6.25 MG tablet TAKE 1 TABLET DAILY 90 tablet 2  . cholecalciferol (VITAMIN D) 400 UNITS TABS Take 2,000 Units by mouth daily.     . clopidogrel (PLAVIX) 75 MG tablet TAKE 1 TABLET DAILY WITH BREAKFAST 90 tablet 1  . Coenzyme Q10 (CO Q-10) 100 MG CAPS Take 300 mg by mouth daily.    . Cyanocobalamin (VITAMIN B-12 CR) 1500 MCG TBCR Take by mouth daily.    . Ferrous Fumarate-Folic Acid (HEMOCYTE-F) 324-1 MG TABS Take 1 tablet by mouth daily. 90 each 1  . finasteride (PROSCAR) 5 MG tablet Take 5 mg by mouth daily.    . isosorbide mononitrate (IMDUR) 30 MG 24 hr tablet TAKE 1 TABLET DAILY 90 tablet 1  . lisinopril (PRINIVIL,ZESTRIL) 5 MG tablet TAKE 2 TABLETS TWICE A DAY 360 tablet  0  . Misc Natural Products (OSTEO BI-FLEX JOINT SHIELD PO) Take 2 tablets by mouth daily.     . Multiple Vitamin (MULTIVITAMIN) capsule Take 1 capsule by mouth daily.    Marland Kitchen NAMENDA 10 MG tablet TAKE 1 TABLET TWICE A DAY 180 tablet 0  . Omega-3 Fatty Acids (FP FISH OIL PO) Take 2,400 mg by mouth daily.     . timolol (BETIMOL) 0.25 % ophthalmic solution Place 1-2 drops into both eyes 2 (two) times daily.    . vitamin C (ASCORBIC ACID) 500 MG tablet Take 500 mg by mouth daily.    . ondansetron (ZOFRAN ODT) 4 MG disintegrating tablet 4mg  ODT q4 hours prn nausea/vomit 10 tablet 0  . rivastigmine (EXELON) 9.5 mg/24hr Place 1 patch onto the skin daily.     No facility-administered medications prior to visit.    Allergies  Allergen Reactions  . Niacin And Related Nausea And Vomiting  . Wellbutrin [Bupropion] Hives    Gi upset    Review of Systems   Constitutional: Negative for fever and malaise/fatigue.  HENT: Negative for congestion.   Eyes: Negative for discharge.  Respiratory: Negative for shortness of breath.   Cardiovascular: Negative for chest pain, palpitations and leg swelling.  Gastrointestinal: Negative for nausea and abdominal pain.  Genitourinary: Negative for dysuria.  Musculoskeletal: Negative for falls.  Skin: Negative for rash.  Neurological: Negative for loss of consciousness and headaches.  Endo/Heme/Allergies: Negative for environmental allergies.  Psychiatric/Behavioral: Positive for depression and memory loss. The patient is not nervous/anxious.        Objective:    Physical Exam  Constitutional: He is oriented to person, place, and time. He appears well-developed and well-nourished. No distress.  HENT:  Head: Normocephalic and atraumatic.  Eyes: Conjunctivae are normal.  Neck: Neck supple. No thyromegaly present.  Cardiovascular: Normal rate, regular rhythm and normal heart sounds.   No murmur heard. Pulmonary/Chest: Effort normal and breath sounds normal. No respiratory distress. He has no wheezes.  Abdominal: Soft. Bowel sounds are normal. He exhibits no mass. There is no tenderness.  Musculoskeletal: He exhibits no edema.  Lymphadenopathy:    He has no cervical adenopathy.  Neurological: He is alert and oriented to person, place, and time.  Skin: Skin is warm and dry.  Psychiatric: His behavior is normal.  Flat affect    BP 152/72 mmHg  Pulse 71  Temp(Src) 98 F (36.7 C) (Oral)  Ht 6' (1.829 m)  Wt 175 lb 8 oz (79.606 kg)  BMI 23.80 kg/m2  SpO2 97% Wt Readings from Last 3 Encounters:  08/31/15 175 lb 8 oz (79.606 kg)  08/26/15 177 lb (80.287 kg)  02/19/15 178 lb 8 oz (80.967 kg)     Lab Results  Component Value Date   WBC 9.5 04/30/2015   HGB 13.2 04/30/2015   HCT 40.0 04/30/2015   PLT 219.0 04/30/2015   GLUCOSE 101* 02/19/2015   CHOL 89 02/19/2015   TRIG 53 02/19/2015   HDL  39* 02/19/2015   LDLCALC 39 02/19/2015   ALT 23 02/19/2015   AST 19 02/19/2015   NA 140 02/19/2015   K 4.4 02/19/2015   CL 104 02/19/2015   CREATININE 0.75 02/19/2015   BUN 15 02/19/2015   CO2 26 02/19/2015   TSH 1.197 02/19/2015   INR 1.11 08/13/2012   HGBA1C 6.1 11/30/2014    Lab Results  Component Value Date   TSH 1.197 02/19/2015   Lab Results  Component Value Date  WBC 9.5 04/30/2015   HGB 13.2 04/30/2015   HCT 40.0 04/30/2015   MCV 91.9 04/30/2015   PLT 219.0 04/30/2015   Lab Results  Component Value Date   NA 140 02/19/2015   K 4.4 02/19/2015   CO2 26 02/19/2015   GLUCOSE 101* 02/19/2015   BUN 15 02/19/2015   CREATININE 0.75 02/19/2015   BILITOT 0.9 02/19/2015   ALKPHOS 68 02/19/2015   AST 19 02/19/2015   ALT 23 02/19/2015   PROT 7.2 02/19/2015   ALBUMIN 4.4 02/19/2015   CALCIUM 9.6 02/19/2015   ANIONGAP 8 12/11/2014   GFR 90.06 11/30/2014   Lab Results  Component Value Date   CHOL 89 02/19/2015   Lab Results  Component Value Date   HDL 39* 02/19/2015   Lab Results  Component Value Date   LDLCALC 39 02/19/2015   Lab Results  Component Value Date   TRIG 53 02/19/2015   Lab Results  Component Value Date   CHOLHDL 2.3 02/19/2015   Lab Results  Component Value Date   HGBA1C 6.1 11/30/2014       Assessment & Plan:   Dementia Lives with wife at [facility]. While disease is progressing he continues to do well  Hypertension Well controlled, no changes to meds. Encouraged heart healthy diet such as the DASH diet and exercise as tolerated.   Anemia Resolved can stop Hemocyte, will monitor  Medicare annual wellness visit, subsequent Patient denies any difficulties at home. No trouble with ADLs, depression or falls. No recent changes to vision or hearing. Is UTD with immunizations. Is UTD with screening. Discussed Advanced Directives, patient agrees to bring [place] copies of documents if can. Encouraged heart healthy diet, exercise as  tolerated and adequate sleep. Labs reviewed.  Allergies verified: UTD   Immunization Status: Flu vaccine-- would like at appointment  Tdap-- 10/20/13 PNA-- 10/20/13 (13), 02/19/15 (23)  Shingles-- 12/04/14  A/P:  Changes to FH, PSH or Personal Hx: UTD  CCS: none found  PSA: none found  Bone Density: none found   Care Teams Updated: Kara Mead, MD- Pulmonology, Kirk Ruths, MD- Cardiology, Michaelle Birks, MD- Urology   ED/Hospital/Urgent Care Visits: None recent  See problem list for risk factors. Not a candidate for aggressive colorectal screening.    I have discontinued Mr. Saulter rivastigmine and ondansetron. I am also having him maintain his Misc Natural Products (OSTEO BI-FLEX JOINT SHIELD PO), finasteride, Omega-3 Fatty Acids (FP FISH OIL PO), multivitamin, acetaminophen, Vitamin B-12 CR, cholecalciferol, vitamin C, timolol, bimatoprost, Co Q-10, NAMENDA, lisinopril, atorvastatin, clopidogrel, Ferrous Fumarate-Folic Acid, isosorbide mononitrate, carvedilol, Probiotic Product (PROBIOTIC DAILY PO), vitamin E, and Carboxymethylcellulose Sodium (REFRESH TEARS OP).  Meds ordered this encounter  Medications  . Probiotic Product (PROBIOTIC DAILY PO)    Sig: Take by mouth.  . vitamin E 400 UNIT capsule    Sig: Take 400 Units by mouth daily.  . Carboxymethylcellulose Sodium (REFRESH TEARS OP)    Sig: Apply to eye.     Elizabeth Sauer, LPN

## 2015-09-05 ENCOUNTER — Other Ambulatory Visit: Payer: Self-pay | Admitting: Family Medicine

## 2015-09-08 ENCOUNTER — Encounter: Payer: Self-pay | Admitting: Family Medicine

## 2015-09-08 DIAGNOSIS — Z0000 Encounter for general adult medical examination without abnormal findings: Secondary | ICD-10-CM | POA: Insufficient documentation

## 2015-09-08 HISTORY — DX: Encounter for general adult medical examination without abnormal findings: Z00.00

## 2015-09-08 NOTE — Assessment & Plan Note (Signed)
Well controlled, no changes to meds. Encouraged heart healthy diet such as the DASH diet and exercise as tolerated.  °

## 2015-09-08 NOTE — Assessment & Plan Note (Signed)
Lives with wife at [facility]. While disease is progressing he continues to do well

## 2015-09-08 NOTE — Assessment & Plan Note (Signed)
Resolved can stop Hemocyte, will monitor

## 2015-09-08 NOTE — Assessment & Plan Note (Signed)
Patient denies any difficulties at home. No trouble with ADLs, depression or falls. No recent changes to vision or hearing. Is UTD with immunizations. Is UTD with screening. Discussed Advanced Directives, patient agrees to bring [place] copies of documents if can. Encouraged heart healthy diet, exercise as tolerated and adequate sleep. Labs reviewed.  Allergies verified: UTD   Immunization Status: Flu vaccine-- would like at appointment  Tdap-- 10/20/13 PNA-- 10/20/13 (13), 02/19/15 (23)  Shingles-- 12/04/14  A/P:  Changes to FH, PSH or Personal Hx: UTD  CCS: none found  PSA: none found  Bone Density: none found   Care Teams Updated: Kara Mead, MD- Pulmonology, Kirk Ruths, MD- Cardiology, Michaelle Birks, MD- Urology   ED/Hospital/Urgent Care Visits: None recent  See problem list for risk factors. Not a candidate for aggressive colorectal screening.

## 2015-09-09 ENCOUNTER — Telehealth: Payer: Self-pay | Admitting: Family Medicine

## 2015-09-09 NOTE — Telephone Encounter (Signed)
-----   Message from Mosie Lukes, MD sent at 09/08/2015  9:59 PM EDT ----- He can stop his hemocyte his anemia is resolved

## 2015-09-09 NOTE — Telephone Encounter (Signed)
Called left message to call back 

## 2015-09-13 NOTE — Telephone Encounter (Signed)
Called the patient informed his daughter Neoma Laming informed of results and PCP instructions.

## 2015-11-05 ENCOUNTER — Ambulatory Visit: Payer: TRICARE For Life (TFL)

## 2015-11-22 ENCOUNTER — Ambulatory Visit (INDEPENDENT_AMBULATORY_CARE_PROVIDER_SITE_OTHER): Payer: Medicare Other | Admitting: Family Medicine

## 2015-11-22 ENCOUNTER — Encounter: Payer: Self-pay | Admitting: Family Medicine

## 2015-11-22 VITALS — BP 128/64 | HR 68 | Temp 98.2°F | Ht 72.0 in | Wt 182.4 lb

## 2015-11-22 DIAGNOSIS — E785 Hyperlipidemia, unspecified: Secondary | ICD-10-CM

## 2015-11-22 DIAGNOSIS — I10 Essential (primary) hypertension: Secondary | ICD-10-CM | POA: Diagnosis not present

## 2015-11-22 DIAGNOSIS — D649 Anemia, unspecified: Secondary | ICD-10-CM

## 2015-11-22 DIAGNOSIS — I2510 Atherosclerotic heart disease of native coronary artery without angina pectoris: Secondary | ICD-10-CM | POA: Diagnosis not present

## 2015-11-22 DIAGNOSIS — F0391 Unspecified dementia with behavioral disturbance: Secondary | ICD-10-CM

## 2015-11-22 NOTE — Progress Notes (Signed)
Pre visit review using our clinic review tool, if applicable. No additional management support is needed unless otherwise documented below in the visit note. 

## 2015-11-22 NOTE — Assessment & Plan Note (Signed)
Tolerating statin, encouraged heart healthy diet, avoid trans fats, minimize simple carbs and saturated fats. Increase exercise as tolerated 

## 2015-11-22 NOTE — Assessment & Plan Note (Signed)
Well controlled, no changes to meds. Encouraged heart healthy diet such as the DASH diet and exercise as tolerated.  °

## 2015-11-22 NOTE — Patient Instructions (Signed)
Anemia, Nonspecific Anemia is a condition in which the concentration of red blood cells or hemoglobin in the blood is below normal. Hemoglobin is a substance in red blood cells that carries oxygen to the tissues of the body. Anemia results in not enough oxygen reaching these tissues.  CAUSES  Common causes of anemia include:   Excessive bleeding. Bleeding may be internal or external. This includes excessive bleeding from periods (in women) or from the intestine.   Poor nutrition.   Chronic kidney, thyroid, and liver disease.  Bone marrow disorders that decrease red blood cell production.  Cancer and treatments for cancer.  HIV, AIDS, and their treatments.  Spleen problems that increase red blood cell destruction.  Blood disorders.  Excess destruction of red blood cells due to infection, medicines, and autoimmune disorders. SIGNS AND SYMPTOMS   Minor weakness.   Dizziness.   Headache.  Palpitations.   Shortness of breath, especially with exercise.   Paleness.  Cold sensitivity.  Indigestion.  Nausea.  Difficulty sleeping.  Difficulty concentrating. Symptoms may occur suddenly or they may develop slowly.  DIAGNOSIS  Additional blood tests are often needed. These help your health care provider determine the best treatment. Your health care provider will check your stool for blood and look for other causes of blood loss.  TREATMENT  Treatment varies depending on the cause of the anemia. Treatment can include:   Supplements of iron, vitamin B12, or folic acid.   Hormone medicines.   A blood transfusion. This may be needed if blood loss is severe.   Hospitalization. This may be needed if there is significant continual blood loss.   Dietary changes.  Spleen removal. HOME CARE INSTRUCTIONS Keep all follow-up appointments. It often takes many weeks to correct anemia, and having your health care provider check on your condition and your response to  treatment is very important. SEEK IMMEDIATE MEDICAL CARE IF:   You develop extreme weakness, shortness of breath, or chest pain.   You become dizzy or have trouble concentrating.  You develop heavy vaginal bleeding.   You develop a rash.   You have bloody or black, tarry stools.   You faint.   You vomit up blood.   You vomit repeatedly.   You have abdominal pain.  You have a fever or persistent symptoms for more than 2-3 days.   You have a fever and your symptoms suddenly get worse.   You are dehydrated.  MAKE SURE YOU:  Understand these instructions.  Will watch your condition.  Will get help right away if you are not doing well or get worse.   This information is not intended to replace advice given to you by your health care provider. Make sure you discuss any questions you have with your health care provider.   Document Released: 12/28/2004 Document Revised: 07/23/2013 Document Reviewed: 05/16/2013 Elsevier Interactive Patient Education 2016 Elsevier Inc.  

## 2015-11-22 NOTE — Progress Notes (Signed)
Subjective:    Patient ID: Samuel Meyers, male    DOB: 09-04-1934, 81 y.o.   MRN: [id number]  No chief complaint on file.   HPI Patient is in today for follow-up accompanied by his wife. He denies complaints. He denies any difficulty with pain or acute concerns. He struggles with advancing dementia but his wife confirms that he's had no recent complaints or illness. Denies CP/palp/SOB/HA/congestion/fevers/GI or GU c/o. Taking meds as prescribed  Past Medical History  Diagnosis Date  . Hypertension   . Hyperlipidemia   . CAD (coronary artery disease)   . Pulmonary embolus (HCC)     Time of CABG  . Dementia   . Depression   . Stroke ([place])     2013--Sept  . Gastroenteritis 10/20/2013  . Anemia 03/01/2014  . Prostate cancer ([place])   . Renal cyst, left 08/27/2014    5x6 cm on Ultrasound September 2015  . Medicare annual wellness visit, subsequent 09/08/2015    Past Surgical History  Procedure Laterality Date  . Coronary artery bypass graft      [place]  . External ear surgery    . Tonsillectomy    . Tee without cardioversion  08/15/2012    Procedure: TRANSESOPHAGEAL ECHOCARDIOGRAM (TEE);  Surgeon: Fay Records, MD;  Location: [facility] ENDOSCOPY;  Service: Cardiovascular;  Laterality: N/A;    Family History  Problem Relation Age of Onset  . Coronary artery disease Father     MI in his 14s  . Heart disease Father   . Early death Father   . Heart disease Mother   . Stroke Mother     some associated memory loss  . Cancer Sister     brain  . Alcohol abuse Daughter   . Heart disease Brother     s/p open heart  . Diabetes Brother   . Stroke Brother   . Cancer Brother     pancreas    Social History   Social History  . Marital Status: Married    Spouse Name: N/A  . Number of Children: 1  . Years of Education: N/A   Occupational History  . security     Retired   Social History Main Topics  . Smoking status: Never Smoker   . Smokeless tobacco: Never Used  .  Alcohol Use: No  . Drug Use: No  . Sexual Activity: No     Comment: lives at [facility] with wife, no dietary restrictions   Other Topics Concern  . Not on file   Social History Narrative    Outpatient Prescriptions Prior to Visit  Medication Sig Dispense Refill  . acetaminophen (TYLENOL) 650 MG CR tablet Take 1,300 mg by mouth daily.    Marland Kitchen atorvastatin (LIPITOR) 20 MG tablet TAKE 1 TABLET DAILY 90 tablet 3  . bimatoprost (LUMIGAN) 0.03 % ophthalmic solution Place 1 drop into both eyes at bedtime.    . Carboxymethylcellulose Sodium (REFRESH TEARS OP) Apply to eye.    . carvedilol (COREG) 6.25 MG tablet TAKE 1 TABLET DAILY 90 tablet 2  . cholecalciferol (VITAMIN D) 400 UNITS TABS Take 2,000 Units by mouth daily.     . clopidogrel (PLAVIX) 75 MG tablet TAKE 1 TABLET DAILY WITH BREAKFAST 90 tablet 0  . Coenzyme Q10 (CO Q-10) 100 MG CAPS Take 300 mg by mouth daily.    . Cyanocobalamin (VITAMIN B-12 CR) 1500 MCG TBCR Take by mouth daily.    . Ferrous Fumarate-Folic Acid (HEMOCYTE-F) 324-1  MG TABS Take 1 tablet by mouth daily. 90 each 1  . finasteride (PROSCAR) 5 MG tablet Take 5 mg by mouth daily.    . isosorbide mononitrate (IMDUR) 30 MG 24 hr tablet TAKE 1 TABLET DAILY 90 tablet 1  . lisinopril (PRINIVIL,ZESTRIL) 10 MG tablet Take 1 tablet (10 mg total) by mouth daily. 90 tablet 3  . Misc Natural Products (OSTEO BI-FLEX JOINT SHIELD PO) Take 2 tablets by mouth daily.     . Multiple Vitamin (MULTIVITAMIN) capsule Take 1 capsule by mouth daily.    Marland Kitchen NAMENDA 10 MG tablet TAKE 1 TABLET TWICE A DAY 180 tablet 0  . Omega-3 Fatty Acids (FP FISH OIL PO) Take 2,400 mg by mouth daily.     . Probiotic Product (PROBIOTIC DAILY PO) Take by mouth.    . timolol (BETIMOL) 0.25 % ophthalmic solution Place 1-2 drops into both eyes 2 (two) times daily.    . vitamin C (ASCORBIC ACID) 500 MG tablet Take 500 mg by mouth daily.    . vitamin E 400 UNIT capsule Take 400 Units by mouth daily.     No  facility-administered medications prior to visit.    Allergies  Allergen Reactions  . Niacin And Related Nausea And Vomiting  . Wellbutrin [Bupropion] Hives    Other reaction(s): GI Upset (intolerance) Gi upset    Review of Systems  Constitutional: Negative for fever and malaise/fatigue.  HENT: Negative for congestion.   Eyes: Negative for discharge.  Respiratory: Negative for shortness of breath.   Cardiovascular: Negative for chest pain, palpitations and leg swelling.  Gastrointestinal: Negative for nausea and abdominal pain.  Genitourinary: Negative for dysuria.  Musculoskeletal: Negative for falls.  Skin: Negative for rash.  Neurological: Negative for loss of consciousness and headaches.  Endo/Heme/Allergies: Negative for environmental allergies.  Psychiatric/Behavioral: Positive for memory loss. Negative for depression. The patient is not nervous/anxious.        Objective:    Physical Exam  Constitutional: He is oriented to person, place, and time. He appears well-developed and well-nourished. No distress.  HENT:  Head: Normocephalic and atraumatic.  Nose: Nose normal.  Eyes: Right eye exhibits no discharge. Left eye exhibits no discharge.  Neck: Normal range of motion. Neck supple.  Cardiovascular: Normal rate and regular rhythm.   No murmur heard. Pulmonary/Chest: Effort normal and breath sounds normal.  Abdominal: Soft. Bowel sounds are normal. There is no tenderness.  Musculoskeletal: He exhibits no edema.  Neurological: He is alert and oriented to person, place, and time.  Skin: Skin is warm and dry.  Psychiatric: He has a normal mood and affect.  Nursing note and vitals reviewed.   BP 128/64 mmHg  Pulse 68  Temp(Src) 98.2 F (36.8 C) (Oral)  Ht 6' (1.829 m)  Wt 182 lb 6.4 oz (82.736 kg)  BMI 24.73 kg/m2  SpO2 97% Wt Readings from Last 3 Encounters:  11/22/15 182 lb 6.4 oz (82.736 kg)  08/31/15 175 lb 8 oz (79.606 kg)  08/26/15 177 lb (80.287 kg)      Lab Results  Component Value Date   WBC 7.6 08/31/2015   HGB 13.4 08/31/2015   HCT 40.9 08/31/2015   PLT 215.0 08/31/2015   GLUCOSE 105* 08/31/2015   CHOL 91 08/31/2015   TRIG 63.0 08/31/2015   HDL 44.00 08/31/2015   LDLCALC 34 08/31/2015   ALT 30 08/31/2015   AST 24 08/31/2015   NA 142 08/31/2015   K 4.7 08/31/2015   CL 105 08/31/2015  CREATININE 0.81 08/31/2015   BUN 16 08/31/2015   CO2 32 08/31/2015   TSH 1.04 08/31/2015   INR 1.11 08/13/2012   HGBA1C 6.1 11/30/2014    Lab Results  Component Value Date   TSH 1.04 08/31/2015   Lab Results  Component Value Date   WBC 7.6 08/31/2015   HGB 13.4 08/31/2015   HCT 40.9 08/31/2015   MCV 92.5 08/31/2015   PLT 215.0 08/31/2015   Lab Results  Component Value Date   NA 142 08/31/2015   K 4.7 08/31/2015   CO2 32 08/31/2015   GLUCOSE 105* 08/31/2015   BUN 16 08/31/2015   CREATININE 0.81 08/31/2015   BILITOT 1.0 08/31/2015   ALKPHOS 65 08/31/2015   AST 24 08/31/2015   ALT 30 08/31/2015   PROT 7.4 08/31/2015   ALBUMIN 4.4 08/31/2015   CALCIUM 9.4 08/31/2015   ANIONGAP 8 12/11/2014   GFR 97.62 08/31/2015   Lab Results  Component Value Date   CHOL 91 08/31/2015   Lab Results  Component Value Date   HDL 44.00 08/31/2015   Lab Results  Component Value Date   LDLCALC 34 08/31/2015   Lab Results  Component Value Date   TRIG 63.0 08/31/2015   Lab Results  Component Value Date   CHOLHDL 2 08/31/2015   Lab Results  Component Value Date   HGBA1C 6.1 11/30/2014       Assessment & Plan:   Problem List Items Addressed This Visit    Anemia - Primary   Relevant Orders   CBC   Dementia (Chronic)    Worsening but living in an assisted living facility with wife and doing well. No changes      Hyperlipidemia (Chronic)    Tolerating statin, encouraged heart healthy diet, avoid trans fats, minimize simple carbs and saturated fats. Increase exercise as tolerated      Hypertension    Well  controlled, no changes to meds. Encouraged heart healthy diet such as the DASH diet and exercise as tolerated.          I am having Mr. Litaker maintain his Misc Natural Products (OSTEO BI-FLEX JOINT SHIELD PO), finasteride, Omega-3 Fatty Acids (FP FISH OIL PO), multivitamin, acetaminophen, Vitamin B-12 CR, cholecalciferol, vitamin C, timolol, bimatoprost, Co Q-10, NAMENDA, atorvastatin, Ferrous Fumarate-Folic Acid, isosorbide mononitrate, carvedilol, Probiotic Product (PROBIOTIC DAILY PO), vitamin E, Carboxymethylcellulose Sodium (REFRESH TEARS OP), lisinopril, and clopidogrel.  No orders of the defined types were placed in this encounter.     Penni Homans, MD

## 2015-11-22 NOTE — Assessment & Plan Note (Signed)
Worsening but living in an assisted living facility with wife and doing well. No changes

## 2015-11-23 LAB — CBC
HEMATOCRIT: 39.2 % (ref 39.0–52.0)
Hemoglobin: 13 g/dL (ref 13.0–17.0)
MCHC: 33.3 g/dL (ref 30.0–36.0)
MCV: 92.7 fl (ref 78.0–100.0)
Platelets: 205 10*3/uL (ref 150.0–400.0)
RBC: 4.22 Mil/uL (ref 4.22–5.81)
RDW: 13.2 % (ref 11.5–15.5)
WBC: 5.8 10*3/uL (ref 4.0–10.5)

## 2015-12-01 ENCOUNTER — Encounter: Payer: Self-pay | Admitting: *Deleted

## 2015-12-02 ENCOUNTER — Other Ambulatory Visit: Payer: Self-pay | Admitting: Family Medicine

## 2016-01-10 ENCOUNTER — Telehealth: Payer: Self-pay | Admitting: Family Medicine

## 2016-01-10 NOTE — Telephone Encounter (Signed)
Caller name: Neoma Laming   Relationship to patient: Daughter  Can be reached: [phone]  Reason for call: She says that  He (pt)  Struggles using his hands when feeding .Marland Kitchen She would like to know if Outpatient therapy an option for her father.

## 2016-01-10 NOTE — Telephone Encounter (Signed)
Called the daughter informed of PCP agreed PT/OT would be a good option.  She will inform her mom and try to get an order from where he lives.

## 2016-01-10 NOTE — Telephone Encounter (Signed)
Yes OT would be an option they should have that where he lives. Please call over to PT at his home and see if they offer OT. If they do give a verbal order for difficulty with hands and feeding

## 2016-01-12 ENCOUNTER — Telehealth: Payer: Self-pay | Admitting: Family Medicine

## 2016-01-12 DIAGNOSIS — R29898 Other symptoms and signs involving the musculoskeletal system: Secondary | ICD-10-CM

## 2016-01-12 NOTE — Telephone Encounter (Signed)
Caller name: [name] from [facility] Can be reached: [phone] Fax# [phone]  Reason for call: wife is wanting to start OT due to pt having trouble feeding himself. Please call for verbal.

## 2016-01-12 NOTE — Telephone Encounter (Signed)
Called and spoke with [name] from [facility] to give a verbal order for the OT.  [name] stated that she would need something in writing requesting the OT, and then she would need it faxed to her.  Please advise.//AB/CMA

## 2016-01-12 NOTE — Telephone Encounter (Signed)
Relation to [id number] Call back number:[phone] Pharmacy: [facility], [place] - [address] [phone] (Phone) [phone] (Fax)         Reason for call:  Spouse requesting a refill lisinopril (PRINIVIL,ZESTRIL) 10 MG tablet

## 2016-01-13 MED ORDER — LISINOPRIL 10 MG PO TABS: 10.0000 mg | ORAL_TABLET | Freq: Every day | ORAL | Status: DC

## 2016-01-13 NOTE — Telephone Encounter (Signed)
Please start an order for OT for feeding evaluation due to trouble with gripping. Then we need to print and fax to [facility]

## 2016-01-14 NOTE — Telephone Encounter (Signed)
faxed OT order over to [facility].

## 2016-01-19 ENCOUNTER — Emergency Department (HOSPITAL_COMMUNITY): Payer: Medicare Other

## 2016-01-19 ENCOUNTER — Encounter (HOSPITAL_COMMUNITY): Payer: Self-pay

## 2016-01-19 ENCOUNTER — Emergency Department (HOSPITAL_COMMUNITY)
Admission: EM | Admit: 2016-01-19 | Discharge: 2016-01-19 | Disposition: A | Discharge status: Home / Self Care | Payer: Medicare Other | Attending: Emergency Medicine | Admitting: Emergency Medicine

## 2016-01-19 DIAGNOSIS — N390 Urinary tract infection, site not specified: Secondary | ICD-10-CM

## 2016-01-19 DIAGNOSIS — I2510 Atherosclerotic heart disease of native coronary artery without angina pectoris: Secondary | ICD-10-CM | POA: Diagnosis not present

## 2016-01-19 DIAGNOSIS — E785 Hyperlipidemia, unspecified: Secondary | ICD-10-CM | POA: Diagnosis not present

## 2016-01-19 DIAGNOSIS — Z8719 Personal history of other diseases of the digestive system: Secondary | ICD-10-CM | POA: Insufficient documentation

## 2016-01-19 DIAGNOSIS — I10 Essential (primary) hypertension: Secondary | ICD-10-CM | POA: Diagnosis not present

## 2016-01-19 DIAGNOSIS — Z7902 Long term (current) use of antithrombotics/antiplatelets: Secondary | ICD-10-CM | POA: Insufficient documentation

## 2016-01-19 DIAGNOSIS — R4182 Altered mental status, unspecified: Secondary | ICD-10-CM | POA: Diagnosis present

## 2016-01-19 DIAGNOSIS — Z8659 Personal history of other mental and behavioral disorders: Secondary | ICD-10-CM | POA: Diagnosis not present

## 2016-01-19 DIAGNOSIS — D649 Anemia, unspecified: Secondary | ICD-10-CM | POA: Diagnosis not present

## 2016-01-19 DIAGNOSIS — Z951 Presence of aortocoronary bypass graft: Secondary | ICD-10-CM | POA: Insufficient documentation

## 2016-01-19 DIAGNOSIS — Z8673 Personal history of transient ischemic attack (TIA), and cerebral infarction without residual deficits: Secondary | ICD-10-CM | POA: Diagnosis not present

## 2016-01-19 DIAGNOSIS — R410 Disorientation, unspecified: Secondary | ICD-10-CM | POA: Diagnosis not present

## 2016-01-19 DIAGNOSIS — F0390 Unspecified dementia without behavioral disturbance: Secondary | ICD-10-CM | POA: Diagnosis not present

## 2016-01-19 DIAGNOSIS — Z79899 Other long term (current) drug therapy: Secondary | ICD-10-CM | POA: Diagnosis not present

## 2016-01-19 DIAGNOSIS — Z86711 Personal history of pulmonary embolism: Secondary | ICD-10-CM | POA: Insufficient documentation

## 2016-01-19 DIAGNOSIS — Z8546 Personal history of malignant neoplasm of prostate: Secondary | ICD-10-CM | POA: Insufficient documentation

## 2016-01-19 DIAGNOSIS — Q6100 Congenital renal cyst, unspecified: Secondary | ICD-10-CM | POA: Diagnosis not present

## 2016-01-19 LAB — COMPREHENSIVE METABOLIC PANEL
ALBUMIN: 3.9 g/dL (ref 3.5–5.0)
ALK PHOS: 50 U/L (ref 38–126)
ALT: 26 U/L (ref 17–63)
ANION GAP: 13 (ref 5–15)
AST: 24 U/L (ref 15–41)
BUN: 18 mg/dL (ref 6–20)
CALCIUM: 9.3 mg/dL (ref 8.9–10.3)
CO2: 24 mmol/L (ref 22–32)
CREATININE: 0.89 mg/dL (ref 0.61–1.24)
Chloride: 107 mmol/L (ref 101–111)
GFR calc Af Amer: >60 mL/min (ref >60)
GFR calc non Af Amer: >60 mL/min (ref >60)
GLUCOSE: 114 mg/dL — AB (ref 65–99)
Potassium: 4.4 mmol/L (ref 3.5–5.1)
SODIUM: 144 mmol/L (ref 135–145)
Total Bilirubin: 0.6 mg/dL (ref 0.3–1.2)
Total Protein: 6.6 g/dL (ref 6.5–8.1)

## 2016-01-19 LAB — CBC
HCT: 38.2 % — ABNORMAL LOW (ref 39.0–52.0)
HEMOGLOBIN: 13.2 g/dL (ref 13.0–17.0)
MCH: 32 pg (ref 26.0–34.0)
MCHC: 34.6 g/dL (ref 30.0–36.0)
MCV: 92.5 fL (ref 78.0–100.0)
PLATELETS: 243 10*3/uL (ref 150–400)
RBC: 4.13 MIL/uL — AB (ref 4.22–5.81)
RDW: 12 % (ref 11.5–15.5)
WBC: 6.8 10*3/uL (ref 4.0–10.5)

## 2016-01-19 LAB — RAPID URINE DRUG SCREEN, HOSP PERFORMED
AMPHETAMINES: NOT DETECTED
BARBITURATES: NOT DETECTED
Benzodiazepines: NOT DETECTED
Cocaine: NOT DETECTED
OPIATES: NOT DETECTED
TETRAHYDROCANNABINOL: NOT DETECTED

## 2016-01-19 LAB — APTT: aPTT: 29 seconds (ref 24–37)

## 2016-01-19 LAB — URINE MICROSCOPIC-ADD ON
RBC / HPF: NONE SEEN RBC/hpf (ref 0–5)
Squamous Epithelial / LPF: NONE SEEN

## 2016-01-19 LAB — I-STAT CHEM 8, ED
BUN: 20 mg/dL (ref 6–20)
CHLORIDE: 103 mmol/L (ref 101–111)
CREATININE: 0.8 mg/dL (ref 0.61–1.24)
Calcium, Ion: 1.11 mmol/L — ABNORMAL LOW (ref 1.13–1.30)
Glucose, Bld: 112 mg/dL — ABNORMAL HIGH (ref 65–99)
HEMATOCRIT: 41 % (ref 39.0–52.0)
Hemoglobin: 13.9 g/dL (ref 13.0–17.0)
Potassium: 4.2 mmol/L (ref 3.5–5.1)
SODIUM: 143 mmol/L (ref 135–145)
TCO2: 27 mmol/L (ref 0–100)

## 2016-01-19 LAB — DIFFERENTIAL
BASOS ABS: 0.1 10*3/uL (ref 0.0–0.1)
Basophils Relative: 1 %
Eosinophils Absolute: 0.3 10*3/uL (ref 0.0–0.7)
Eosinophils Relative: 4 %
LYMPHS ABS: 1.7 10*3/uL (ref 0.7–4.0)
LYMPHS PCT: 26 %
Monocytes Absolute: 0.7 10*3/uL (ref 0.1–1.0)
Monocytes Relative: 10 %
NEUTROS ABS: 4.1 10*3/uL (ref 1.7–7.7)
NEUTROS PCT: 59 %

## 2016-01-19 LAB — URINALYSIS, ROUTINE W REFLEX MICROSCOPIC
BILIRUBIN URINE: NEGATIVE
Glucose, UA: NEGATIVE mg/dL
Hgb urine dipstick: NEGATIVE
KETONES UR: NEGATIVE mg/dL
NITRITE: NEGATIVE
PH: 6.5 (ref 5.0–8.0)
Protein, ur: NEGATIVE mg/dL
Specific Gravity, Urine: 1.024 (ref 1.005–1.030)

## 2016-01-19 LAB — PROTIME-INR
INR: 1.13 (ref 0.00–1.49)
PROTHROMBIN TIME: 14.7 s (ref 11.6–15.2)

## 2016-01-19 LAB — CBG MONITORING, ED: Glucose-Capillary: 107 mg/dL — ABNORMAL HIGH (ref 65–99)

## 2016-01-19 LAB — I-STAT TROPONIN, ED: Troponin i, poc: 0 ng/mL (ref 0.00–0.08)

## 2016-01-19 LAB — ETHANOL: Alcohol, Ethyl (B): <5 mg/dL (ref <5)

## 2016-01-19 MED ORDER — LORAZEPAM 2 MG/ML IJ SOLN: 0.5000 mg | Freq: Once | INTRAMUSCULAR | Status: DC

## 2016-01-19 MED ORDER — CEPHALEXIN 500 MG PO CAPS: 500.0000 mg | ORAL_CAPSULE | Freq: Four times a day (QID) | ORAL | Status: DC

## 2016-01-19 NOTE — Progress Notes (Signed)
Pt came over for brain MRI, pt was very confused, had short attention span, had pt in scanner and started exam, pt started saying please, please and trying to take head coil off.  Called nurse and while on the phone with her I had to run because pt was pulling himself out of the scanner and was at risk for fall.  Exam aborted at this point and pt taken back to the ER.

## 2016-01-19 NOTE — ED Notes (Signed)
Pt ambulatory in room w/ moderately steady gait. Pt able to tolerate PO fluids w/o difficulty. Pt wife helping pt get dressed, pt family requesting discharge. EDP aware.

## 2016-01-19 NOTE — ED Notes (Signed)
Pt attempted to obtain urine specimen, unable to do so at this time.

## 2016-01-19 NOTE — ED Notes (Addendum)
Per EMS - pt found wandering around at assisted living facility [facility]). Pt hx dementia and 2 prev strokes. Bilateral weak grips and dysphagia is pt baseline. Increased word confusion per family. Increased frequency urination over last 2 weeks per pt wife. Hx HTN, CAD, heart surgery 1995. Pt also has beginning stages prostate cancer.

## 2016-01-19 NOTE — ED Provider Notes (Signed)
CSN: [id number]     Arrival date & time 01/19/16  1603 History   First MD Initiated Contact with Patient 01/19/16 1610     Chief Complaint  Patient presents with  . Altered Mental Status     (Consider location/radiation/quality/duration/timing/severity/associated sxs/prior Treatment) HPI Comments: Level V caveat for dementia. Patient from assisted living facility with elevated blood pressure. Patient's wife states she was out shopping and the desk staff noticed the patient was acting more confused than usual. He has had a stroke previously with some expressive aphasia. They checked his blood pressure and it was 210/100 so they referred him to the ED. Patient denies complaints. His family states his confusion is at baseline at this time. He normally walks on his own. Patient has had urinary frequency and urgency over the past days but does have a history of prostate cancer that is currently not being treated per his wife. No falls. Patient denies any head, neck, back, chest or abdominal pain. No fever. No cough. No urinary symptoms.  The history is provided by the patient and the EMS personnel. The history is limited by the condition of the patient.    Past Medical History  Diagnosis Date  . Hypertension   . Hyperlipidemia   . CAD (coronary artery disease)   . Pulmonary embolus (HCC)     Time of CABG  . Dementia   . Depression   . Stroke ([place])     2013--Sept  . Gastroenteritis 10/20/2013  . Anemia 03/01/2014  . Prostate cancer ([place])   . Renal cyst, left 08/27/2014    5x6 cm on Ultrasound September 2015  . Medicare annual wellness visit, subsequent 09/08/2015   Past Surgical History  Procedure Laterality Date  . Coronary artery bypass graft      Meta  . External ear surgery    . Tonsillectomy    . Tee without cardioversion  08/15/2012    Procedure: TRANSESOPHAGEAL ECHOCARDIOGRAM (TEE);  Surgeon: Fay Records, MD;  Location: [facility] ENDOSCOPY;  Service: Cardiovascular;   Laterality: N/A;   Family History  Problem Relation Age of Onset  . Coronary artery disease Father     MI in his 40s  . Heart disease Father   . Early death Father   . Heart disease Mother   . Stroke Mother     some associated memory loss  . Cancer Sister     brain  . Alcohol abuse Daughter   . Heart disease Brother     s/p open heart  . Diabetes Brother   . Stroke Brother   . Cancer Brother     pancreas   Social History  Substance Use Topics  . Smoking status: Never Smoker   . Smokeless tobacco: Never Used  . Alcohol Use: No    Review of Systems  Unable to perform ROS: Mental status change      Allergies  Niacin and related and Wellbutrin  Home Medications   Prior to Admission medications   Medication Sig Start Date End Date Taking? Authorizing Provider  acetaminophen (TYLENOL) 650 MG CR tablet Take 1,300 mg by mouth daily.   Yes Historical Provider, MD  atorvastatin (LIPITOR) 20 MG tablet TAKE 1 TABLET DAILY 02/05/15  Yes Mosie Lukes, MD  bimatoprost (LUMIGAN) 0.03 % ophthalmic solution Place 1 drop into both eyes at bedtime.   Yes Historical Provider, MD  Carboxymethylcellulose Sodium (REFRESH TEARS OP) Apply 1 drop to eye daily as needed (for dry eyes).  Yes Historical Provider, MD  carvedilol (COREG) 6.25 MG tablet TAKE 1 TABLET DAILY 08/06/15  Yes Mosie Lukes, MD  Cholecalciferol 2000 units CAPS Take 2,000 Units by mouth daily.   Yes Historical Provider, MD  clopidogrel (PLAVIX) 75 MG tablet TAKE 1 TABLET DAILY WITH BREAKFAST 12/02/15  Yes Midge Minium, MD  Coenzyme Q10 (CO Q-10) 100 MG CAPS Take 300 mg by mouth daily.   Yes Historical Provider, MD  Ferrous Fumarate-Folic Acid (HEMOCYTE-F) 324-1 MG TABS Take 1 tablet by mouth daily. 07/19/15  Yes Mosie Lukes, MD  finasteride (PROSCAR) 5 MG tablet Take 5 mg by mouth daily.   Yes Historical Provider, MD  isosorbide mononitrate (IMDUR) 30 MG 24 hr tablet TAKE 1 TABLET DAILY 08/04/15  Yes Mosie Lukes, MD  lisinopril (PRINIVIL,ZESTRIL) 10 MG tablet Take 1 tablet (10 mg total) by mouth daily. 01/13/16  Yes Mosie Lukes, MD  Methylcobalamin (B-12) 5000 MCG TBDP Take 5,000 Units by mouth daily.   Yes Historical Provider, MD  Misc Natural Products (OSTEO BI-FLEX JOINT SHIELD PO) Take 2 tablets by mouth daily.    Yes Historical Provider, MD  Multiple Vitamin (MULTIVITAMIN) capsule Take 1 capsule by mouth daily.   Yes Historical Provider, MD  Omega-3 Fatty Acids (FP FISH OIL PO) Take 2,400 mg by mouth daily.    Yes Historical Provider, MD  Probiotic Product (PROBIOTIC DAILY PO) Take 1 capsule by mouth daily.    Yes Historical Provider, MD  timolol (BETIMOL) 0.25 % ophthalmic solution Place 1-2 drops into both eyes 2 (two) times daily.   Yes Historical Provider, MD  vitamin C (ASCORBIC ACID) 500 MG tablet Take 500 mg by mouth daily.   Yes Historical Provider, MD  vitamin E 400 UNIT capsule Take 400 Units by mouth daily.   Yes Historical Provider, MD  cephALEXin (KEFLEX) 500 MG capsule Take 1 capsule (500 mg total) by mouth 4 (four) times daily. 01/19/16   Ezequiel Essex, MD  NAMENDA 10 MG tablet TAKE 1 TABLET TWICE A DAY 08/04/14   Mosie Lukes, MD   BP 178/88 mmHg  Pulse 71  Temp(Src) 97.8 F (36.6 C) (Oral)  Resp 16  SpO2 100% Physical Exam  Constitutional: He is oriented to person, place, and time. He appears well-developed and well-nourished. No distress.  Expressive aphasia. Oriented x1  HENT:  Head: Normocephalic and atraumatic.  Mouth/Throat: Oropharynx is clear and moist. No oropharyngeal exudate.  Eyes: Conjunctivae and EOM are normal. Pupils are equal, round, and reactive to light.  Neck: Normal range of motion. Neck supple.  No meningismus.  Cardiovascular: Normal rate, regular rhythm, normal heart sounds and intact distal pulses.   No murmur heard. Pulmonary/Chest: Effort normal and breath sounds normal. No respiratory distress.  Abdominal: Soft. There is no tenderness.  There is no rebound and no guarding.  Musculoskeletal: Normal range of motion. He exhibits no edema or tenderness.  Neurological: He is alert and oriented to person, place, and time. No cranial nerve deficit. He exhibits normal muscle tone. Coordination normal.  Oriented x1. Expressive aphasia with word finding difficulty at baseline per family. No pronator drift. 5/5 strength throughout. CN 2-12 intact.Equal grip strength. Sensation intact.  Difficulty performing finger to nose bilaterALLY./  Skin: Skin is warm.  Psychiatric: He has a normal mood and affect. His behavior is normal.  Nursing note and vitals reviewed.   ED Course  Procedures (including critical care time) Labs Review Labs Reviewed  CBC - Abnormal;  Notable for the following:    RBC 4.13 (*)    HCT 38.2 (*)    All other components within normal limits  COMPREHENSIVE METABOLIC PANEL - Abnormal; Notable for the following:    Glucose, Bld 114 (*)    All other components within normal limits  URINALYSIS, ROUTINE W REFLEX MICROSCOPIC (NOT AT [facility]) - Abnormal; Notable for the following:    APPearance HAZY (*)    Leukocytes, UA MODERATE (*)    All other components within normal limits  URINE MICROSCOPIC-ADD ON - Abnormal; Notable for the following:    Bacteria, UA MANY (*)    Casts HYALINE CASTS (*)    All other components within normal limits  I-STAT CHEM 8, ED - Abnormal; Notable for the following:    Glucose, Bld 112 (*)    Calcium, Ion 1.11 (*)    All other components within normal limits  CBG MONITORING, ED - Abnormal; Notable for the following:    Glucose-Capillary 107 (*)    All other components within normal limits  URINE CULTURE  ETHANOL  PROTIME-INR  APTT  DIFFERENTIAL  URINE RAPID DRUG SCREEN, HOSP PERFORMED  I-STAT TROPOININ, ED    Imaging Review Dg Chest 2 View  01/19/2016  CLINICAL DATA:  82 year old male with altered mental status EXAM: CHEST  2 VIEW COMPARISON:  Most recent prior chest x-ray  12/23/2014 FINDINGS: Stable cardiac and mediastinal contours. Atherosclerotic calcifications again noted in the transverse aorta. Patient is status post median sternotomy with evidence of prior CABG including LIMA bypass. Low inspiratory volumes with mild subtle patchy airspace opacity in the left lower lobe. No pneumothorax. No pulmonary edema. No acute osseous abnormality. IMPRESSION: Low inspiratory volumes with subtle left lower lobe patchy airspace opacity which may reflect atelectasis or early pneumonia. Electronically Signed   By: Jacqulynn Cadet M.D.   On: 01/19/2016 17:02   Ct Head Wo Contrast  01/19/2016  CLINICAL DATA:  82 year old male with altered mental status. Past history of stroke. EXAM: CT HEAD WITHOUT CONTRAST TECHNIQUE: Contiguous axial images were obtained from the base of the skull through the vertex without intravenous contrast. COMPARISON:  Prior head CT 10/15/2014 FINDINGS: Negative for acute intracranial hemorrhage, acute infarction, mass, mass effect, hydrocephalus or midline shift. Gray-white differentiation is preserved throughout. Similar pattern of age-appropriate cerebral and cerebellar cortical atrophy. Central atrophy results in ex vacuo dilatation of the lateral ventricles. There is significant confluent periventricular, subcortical and deep white matter hypoattenuation consistent with advanced chronic microvascular ischemic white matter disease. Calcification of both basal ganglia. Mild focal encephalomalacia in the right PCA territory likely secondary to remote infarct. No focal soft tissue or calvarial abnormality. Globes and orbits are symmetric and intact. Chronic right mastoid effusion. Atherosclerotic calcifications in both cavernous carotid arteries. IMPRESSION: 1. No acute intracranial abnormality. 2. Stable pattern of atrophy and advanced chronic microvascular ischemic white matter disease. 3. Chronic right mastoid effusion noted incidentally. Electronically Signed    By: Jacqulynn Cadet M.D.   On: 01/19/2016 16:49   I have personally reviewed and evaluated these images and lab results as part of my medical decision-making.   EKG Interpretation   Date/Time:  Wednesday January 19 2016 17:24:37 EST Ventricular Rate:  70 PR Interval:  219 QRS Duration: 90 QT Interval:  424 QTC Calculation: 457 R Axis:   63 Text Interpretation:  Sinus rhythm Borderline prolonged PR interval No  significant change was found Confirmed by Wyvonnia Dusky  MD, Valia Wingard [phone]) on  01/19/2016 5:45:28 PM  MDM   Final diagnoses:  Confusion  Urinary tract infection without hematuria, site unspecified   Patient from nursing home with increased confusion, history of stroke. Word finding difficulty at baseline according to family. No focal neuro deficits.  CT head is stable. Labs appear to be at baseline UA positive for infection.  Patient unable to complete MRI due to agitation. Family requests no further attempts at MRI even with medication. Chest x-ray is stable. There is subtle questionable infiltrate. Patient has no cough, fever or leukocytosis.  Patient is tolerating by mouth and ambulatory. Family requesting patient go home. They understand there is a small risk of missing a stroke without the MRI. He is already on plavix. They feel he is at his baseline and wished him to return back to his facility. Will treat UTI. Return precautions discussed.  Ezequiel Essex, MD 01/20/16 [phone]

## 2016-01-19 NOTE — Discharge Instructions (Signed)
Urinary Tract Infection You declined an MRI today.  Take the antibiotic as prescribed for a urinary tract infection. Return to the ED if you develop new or worsening symptoms. Urinary tract infections (UTIs) can develop anywhere along your urinary tract. Your urinary tract is your body's drainage system for removing wastes and extra water. Your urinary tract includes two kidneys, two ureters, a bladder, and a urethra. Your kidneys are a pair of bean-shaped organs. Each kidney is about the size of your fist. They are located below your ribs, one on each side of your spine. CAUSES Infections are caused by microbes, which are microscopic organisms, including fungi, viruses, and bacteria. These organisms are so small that they can only be seen through a microscope. Bacteria are the microbes that most commonly cause UTIs. SYMPTOMS  Symptoms of UTIs may vary by age and gender of the patient and by the location of the infection. Symptoms in young women typically include a frequent and intense urge to urinate and a painful, burning feeling in the bladder or urethra during urination. Older women and men are more likely to be tired, shaky, and weak and have muscle aches and abdominal pain. A fever may mean the infection is in your kidneys. Other symptoms of a kidney infection include pain in your back or sides below the ribs, nausea, and vomiting. DIAGNOSIS To diagnose a UTI, your caregiver will ask you about your symptoms. Your caregiver will also ask you to provide a urine sample. The urine sample will be tested for bacteria and white blood cells. White blood cells are made by your body to help fight infection. TREATMENT  Typically, UTIs can be treated with medication. Because most UTIs are caused by a bacterial infection, they usually can be treated with the use of antibiotics. The choice of antibiotic and length of treatment depend on your symptoms and the type of bacteria causing your infection. HOME CARE  INSTRUCTIONS  If you were prescribed antibiotics, take them exactly as your caregiver instructs you. Finish the medication even if you feel better after you have only taken some of the medication.  Drink enough water and fluids to keep your urine clear or pale yellow.  Avoid caffeine, tea, and carbonated beverages. They tend to irritate your bladder.  Empty your bladder often. Avoid holding urine for long periods of time.  Empty your bladder before and after sexual intercourse.  After a bowel movement, women should cleanse from front to back. Use each tissue only once. SEEK MEDICAL CARE IF:   You have back pain.  You develop a fever.  Your symptoms do not begin to resolve within 3 days. SEEK IMMEDIATE MEDICAL CARE IF:   You have severe back pain or lower abdominal pain.  You develop chills.  You have nausea or vomiting.  You have continued burning or discomfort with urination. MAKE SURE YOU:   Understand these instructions.  Will watch your condition.  Will get help right away if you are not doing well or get worse.   This information is not intended to replace advice given to you by your health care provider. Make sure you discuss any questions you have with your health care provider.   Document Released: 08/30/2005 Document Revised: 08/11/2015 Document Reviewed: 12/29/2011 Elsevier Interactive Patient Education [employer].

## 2016-01-19 NOTE — ED Notes (Signed)
Pt transported to MRI 

## 2016-01-19 NOTE — ED Notes (Signed)
Unable to do MRI d/t pt crawling out of scanner. Pt family requesting to not have MRI done. Dr. Wyvonnia Dusky aware.

## 2016-01-20 MED ORDER — LISINOPRIL 10 MG PO TABS: 10.0000 mg | ORAL_TABLET | Freq: Every day | ORAL | Status: AC

## 2016-01-20 MED FILL — CEPHALEXIN 500 MG CAPSULE: 500 | 10 days supply | Qty: 40 | Fill #0

## 2016-01-20 NOTE — Addendum Note (Signed)
Addended by: Sharon Seller B on: 01/20/2016 11:01 AM   Modules accepted: Orders

## 2016-01-20 NOTE — Telephone Encounter (Signed)
Patient would like lisinopril to go to  [place], [place] [phone] (Phone) [phone] (Fax)

## 2016-01-24 ENCOUNTER — Ambulatory Visit (INDEPENDENT_AMBULATORY_CARE_PROVIDER_SITE_OTHER): Payer: Medicare Other | Admitting: Family Medicine

## 2016-01-24 ENCOUNTER — Encounter: Payer: Self-pay | Admitting: Family Medicine

## 2016-01-24 VITALS — BP 138/80 | HR 68 | Temp 97.5°F | Ht 72.0 in | Wt 178.6 lb

## 2016-01-24 DIAGNOSIS — I10 Essential (primary) hypertension: Secondary | ICD-10-CM | POA: Diagnosis not present

## 2016-01-24 DIAGNOSIS — N390 Urinary tract infection, site not specified: Secondary | ICD-10-CM

## 2016-01-24 DIAGNOSIS — N3000 Acute cystitis without hematuria: Secondary | ICD-10-CM

## 2016-01-24 NOTE — Patient Instructions (Signed)

## 2016-01-24 NOTE — Progress Notes (Signed)
Pre visit review using our clinic review tool, if applicable. No additional management support is needed unless otherwise documented below in the visit note. 

## 2016-01-25 ENCOUNTER — Telehealth: Payer: Self-pay | Admitting: *Deleted

## 2016-01-25 LAB — URINALYSIS
BILIRUBIN URINE: NEGATIVE
HGB URINE DIPSTICK: NEGATIVE
KETONES UR: NEGATIVE
Leukocytes, UA: NEGATIVE
Nitrite: NEGATIVE
Specific Gravity, Urine: >=1.03 — AB (ref 1.000–1.030)
Total Protein, Urine: NEGATIVE
URINE GLUCOSE: NEGATIVE
UROBILINOGEN UA: 0.2 (ref 0.0–1.0)
pH: 5.5 (ref 5.0–8.0)

## 2016-01-25 NOTE — Telephone Encounter (Signed)
Received OT Plan of care; forwarded to provider/SLS 02/21

## 2016-01-26 LAB — URINE CULTURE
Colony Count: NO GROWTH
Organism ID, Bacteria: NO GROWTH

## 2016-01-31 ENCOUNTER — Other Ambulatory Visit: Payer: Self-pay | Admitting: Family Medicine

## 2016-02-06 DIAGNOSIS — N390 Urinary tract infection, site not specified: Secondary | ICD-10-CM | POA: Insufficient documentation

## 2016-02-06 NOTE — Assessment & Plan Note (Signed)
Well controlled, no changes to meds. Encouraged heart healthy diet such as the DASH diet and exercise as tolerated.  °

## 2016-02-06 NOTE — Progress Notes (Signed)
Patient ID: Samuel Meyers, male   DOB: [birth year], 82 y.o.   MRN: [id number]   Subjective:    Patient ID: Samuel Meyers, male    DOB: [birth year], 82 y.o.   MRN: [id number]  Chief Complaint  Patient presents with  . Follow-up    HPI Patient is in today for ER follow up. Patient with dementia is accompanied by wife and daughter. Is doing much better today. He was treated for UTI and his agitation is improved. He has no acute complaints today. Denies CP/palp/SOB/HA/congestion/fevers/GI or GU c/o. Taking meds as prescribed  Past Medical History  Diagnosis Date  . Hypertension   . Hyperlipidemia   . CAD (coronary artery disease)   . Pulmonary embolus (HCC)     Time of CABG  . Dementia   . Depression   . Stroke ([place])     2013--Sept  . Gastroenteritis 10/20/2013  . Anemia 03/01/2014  . Prostate cancer ([place])   . Renal cyst, left 08/27/2014    5x6 cm on Ultrasound September 2015  . Medicare annual wellness visit, subsequent 09/08/2015    Past Surgical History  Procedure Laterality Date  . Coronary artery bypass graft      [place]  . External ear surgery    . Tonsillectomy    . Tee without cardioversion  08/15/2012    Procedure: TRANSESOPHAGEAL ECHOCARDIOGRAM (TEE);  Surgeon: Fay Records, MD;  Location: [facility] ENDOSCOPY;  Service: Cardiovascular;  Laterality: N/A;    Family History  Problem Relation Age of Onset  . Coronary artery disease Father     MI in his 14s  . Heart disease Father   . Early death Father   . Heart disease Mother   . Stroke Mother     some associated memory loss  . Cancer Sister     brain  . Alcohol abuse Daughter   . Heart disease Brother     s/p open heart  . Diabetes Brother   . Stroke Brother   . Cancer Brother     pancreas    Social History   Social History  . Marital Status: Married    Spouse Name: N/A  . Number of Children: 1  . Years of Education: N/A   Occupational History  . security     Retired   Social History Main  Topics  . Smoking status: Never Smoker   . Smokeless tobacco: Never Used  . Alcohol Use: No  . Drug Use: No  . Sexual Activity: No     Comment: lives [facility] with wife, no dietary restrictions   Other Topics Concern  . Not on file   Social History Narrative    Outpatient Prescriptions Prior to Visit  Medication Sig Dispense Refill  . acetaminophen (TYLENOL) 650 MG CR tablet Take 1,300 mg by mouth daily.    . bimatoprost (LUMIGAN) 0.03 % ophthalmic solution Place 1 drop into both eyes at bedtime.    . Carboxymethylcellulose Sodium (REFRESH TEARS OP) Apply 1 drop to eye daily as needed (for dry eyes).     . carvedilol (COREG) 6.25 MG tablet TAKE 1 TABLET DAILY 90 tablet 2  . cephALEXin (KEFLEX) 500 MG capsule Take 1 capsule (500 mg total) by mouth 4 (four) times daily. 40 capsule 0  . Cholecalciferol 2000 units CAPS Take 2,000 Units by mouth daily.    . clopidogrel (PLAVIX) 75 MG tablet TAKE 1 TABLET DAILY WITH BREAKFAST 90 tablet 0  . Coenzyme Q10 (  CO Q-10) 100 MG CAPS Take 300 mg by mouth daily.    . Ferrous Fumarate-Folic Acid (HEMOCYTE-F) 324-1 MG TABS Take 1 tablet by mouth daily. 90 each 1  . finasteride (PROSCAR) 5 MG tablet Take 5 mg by mouth daily.    Marland Kitchen lisinopril (PRINIVIL,ZESTRIL) 10 MG tablet Take 1 tablet (10 mg total) by mouth daily. 90 tablet 3  . Methylcobalamin (B-12) 5000 MCG TBDP Take 5,000 Units by mouth daily.    . Misc Natural Products (OSTEO BI-FLEX JOINT SHIELD PO) Take 2 tablets by mouth daily.     . Multiple Vitamin (MULTIVITAMIN) capsule Take 1 capsule by mouth daily.    . Omega-3 Fatty Acids (FP FISH OIL PO) Take 2,400 mg by mouth daily.     . Probiotic Product (PROBIOTIC DAILY PO) Take 1 capsule by mouth daily.     . timolol (BETIMOL) 0.25 % ophthalmic solution Place 1-2 drops into both eyes 2 (two) times daily.    . vitamin C (ASCORBIC ACID) 500 MG tablet Take 500 mg by mouth daily.    . vitamin E 400 UNIT capsule Take 400 Units by mouth daily.     Marland Kitchen atorvastatin (LIPITOR) 20 MG tablet TAKE 1 TABLET DAILY 90 tablet 3  . isosorbide mononitrate (IMDUR) 30 MG 24 hr tablet TAKE 1 TABLET DAILY 90 tablet 1  . NAMENDA 10 MG tablet TAKE 1 TABLET TWICE A DAY 180 tablet 0   No facility-administered medications prior to visit.    Allergies  Allergen Reactions  . Niacin And Related Nausea And Vomiting  . Wellbutrin [Bupropion] Hives and Other (See Comments)    Other reaction(s): GI Upset (intolerance)     Review of Systems  Constitutional: Negative for fever and malaise/fatigue.  HENT: Negative for congestion.   Eyes: Negative for discharge.  Respiratory: Negative for shortness of breath.   Cardiovascular: Negative for chest pain, palpitations and leg swelling.  Gastrointestinal: Negative for nausea and abdominal pain.  Genitourinary: Negative for dysuria.  Musculoskeletal: Negative for falls.  Skin: Negative for rash.  Neurological: Negative for loss of consciousness and headaches.  Endo/Heme/Allergies: Negative for environmental allergies.  Psychiatric/Behavioral: Negative for depression. The patient is not nervous/anxious.        Objective:    Physical Exam  Constitutional: He is oriented to person, place, and time. He appears well-developed and well-nourished. No distress.  HENT:  Head: Normocephalic and atraumatic.  Nose: Nose normal.  Eyes: Right eye exhibits no discharge. Left eye exhibits no discharge.  Neck: Normal range of motion. Neck supple.  Cardiovascular: Normal rate and regular rhythm.   Pulmonary/Chest: Effort normal and breath sounds normal.  Abdominal: Soft. Bowel sounds are normal. There is no tenderness.  Musculoskeletal: He exhibits no edema.  Neurological: He is alert and oriented to person, place, and time.  Skin: Skin is warm and dry.  Psychiatric: He has a normal mood and affect.  Nursing note and vitals reviewed.   BP 138/80 mmHg  Pulse 68  Temp(Src) 97.5 F (36.4 C) (Oral)  Ht 6' (1.829 m)   Wt 178 lb 9.6 oz (81.012 kg)  BMI 24.22 kg/m2  SpO2 98% Wt Readings from Last 3 Encounters:  01/24/16 178 lb 9.6 oz (81.012 kg)  11/22/15 182 lb 6.4 oz (82.736 kg)  08/31/15 175 lb 8 oz (79.606 kg)     Lab Results  Component Value Date   WBC 6.8 01/19/2016   HGB 13.9 01/19/2016   HCT 41.0 01/19/2016   PLT 243  01/19/2016   GLUCOSE 112* 01/19/2016   CHOL 91 08/31/2015   TRIG 63.0 08/31/2015   HDL 44.00 08/31/2015   LDLCALC 34 08/31/2015   ALT 26 01/19/2016   AST 24 01/19/2016   NA 143 01/19/2016   K 4.2 01/19/2016   CL 103 01/19/2016   CREATININE 0.80 01/19/2016   BUN 20 01/19/2016   CO2 24 01/19/2016   TSH 1.04 08/31/2015   INR 1.13 01/19/2016   HGBA1C 6.1 11/30/2014    Lab Results  Component Value Date   TSH 1.04 08/31/2015   Lab Results  Component Value Date   WBC 6.8 01/19/2016   HGB 13.9 01/19/2016   HCT 41.0 01/19/2016   MCV 92.5 01/19/2016   PLT 243 01/19/2016   Lab Results  Component Value Date   NA 143 01/19/2016   K 4.2 01/19/2016   CO2 24 01/19/2016   GLUCOSE 112* 01/19/2016   BUN 20 01/19/2016   CREATININE 0.80 01/19/2016   BILITOT 0.6 01/19/2016   ALKPHOS 50 01/19/2016   AST 24 01/19/2016   ALT 26 01/19/2016   PROT 6.6 01/19/2016   ALBUMIN 3.9 01/19/2016   CALCIUM 9.3 01/19/2016   ANIONGAP 13 01/19/2016   GFR 97.62 08/31/2015   Lab Results  Component Value Date   CHOL 91 08/31/2015   Lab Results  Component Value Date   HDL 44.00 08/31/2015   Lab Results  Component Value Date   LDLCALC 34 08/31/2015   Lab Results  Component Value Date   TRIG 63.0 08/31/2015   Lab Results  Component Value Date   CHOLHDL 2 08/31/2015   Lab Results  Component Value Date   HGBA1C 6.1 11/30/2014       Assessment & Plan:   Problem List Items Addressed This Visit    Hypertension    Well controlled, no changes to meds. Encouraged heart healthy diet such as the DASH diet and exercise as tolerated.       UTI (urinary tract infection) -  Primary    Doing much better was seen and treated in ED, urine sample unremarkable today.       Relevant Orders   Urinalysis (Completed)   Urine culture (Completed)      I have discontinued Mr. Ladislav Glotfelty. I am also having him maintain his Misc Natural Products (OSTEO BI-FLEX JOINT SHIELD PO), finasteride, Omega-3 Fatty Acids (FP FISH OIL PO), multivitamin, acetaminophen, vitamin C, timolol, bimatoprost, Co Q-10, Ferrous Fumarate-Folic Acid, carvedilol, Probiotic Product (PROBIOTIC DAILY PO), vitamin E, Carboxymethylcellulose Sodium (REFRESH TEARS OP), clopidogrel, Cholecalciferol, B-12, cephALEXin, and lisinopril.  No orders of the defined types were placed in this encounter.     Penni Homans, MD

## 2016-02-06 NOTE — Assessment & Plan Note (Signed)
Doing much better was seen and treated in ED, urine sample unremarkable today.

## 2016-02-09 ENCOUNTER — Emergency Department (HOSPITAL_BASED_OUTPATIENT_CLINIC_OR_DEPARTMENT_OTHER)
Admission: EM | Admit: 2016-02-09 | Discharge: 2016-02-09 | Disposition: A | Discharge status: Home / Self Care | Payer: Medicare Other | Attending: Emergency Medicine | Admitting: Emergency Medicine

## 2016-02-09 ENCOUNTER — Telehealth: Payer: Self-pay | Admitting: *Deleted

## 2016-02-09 ENCOUNTER — Telehealth: Payer: Self-pay | Admitting: Physician Assistant

## 2016-02-09 ENCOUNTER — Encounter (HOSPITAL_BASED_OUTPATIENT_CLINIC_OR_DEPARTMENT_OTHER): Payer: Self-pay | Admitting: Emergency Medicine

## 2016-02-09 DIAGNOSIS — Z951 Presence of aortocoronary bypass graft: Secondary | ICD-10-CM | POA: Insufficient documentation

## 2016-02-09 DIAGNOSIS — Z79899 Other long term (current) drug therapy: Secondary | ICD-10-CM | POA: Diagnosis not present

## 2016-02-09 DIAGNOSIS — I10 Essential (primary) hypertension: Secondary | ICD-10-CM | POA: Insufficient documentation

## 2016-02-09 DIAGNOSIS — Z86711 Personal history of pulmonary embolism: Secondary | ICD-10-CM | POA: Diagnosis not present

## 2016-02-09 DIAGNOSIS — F0391 Unspecified dementia with behavioral disturbance: Secondary | ICD-10-CM | POA: Insufficient documentation

## 2016-02-09 DIAGNOSIS — Z792 Long term (current) use of antibiotics: Secondary | ICD-10-CM | POA: Insufficient documentation

## 2016-02-09 DIAGNOSIS — Z862 Personal history of diseases of the blood and blood-forming organs and certain disorders involving the immune mechanism: Secondary | ICD-10-CM | POA: Insufficient documentation

## 2016-02-09 DIAGNOSIS — Z8673 Personal history of transient ischemic attack (TIA), and cerebral infarction without residual deficits: Secondary | ICD-10-CM | POA: Insufficient documentation

## 2016-02-09 DIAGNOSIS — R4182 Altered mental status, unspecified: Secondary | ICD-10-CM | POA: Diagnosis present

## 2016-02-09 DIAGNOSIS — I2510 Atherosclerotic heart disease of native coronary artery without angina pectoris: Secondary | ICD-10-CM | POA: Diagnosis not present

## 2016-02-09 DIAGNOSIS — Q6100 Congenital renal cyst, unspecified: Secondary | ICD-10-CM | POA: Diagnosis not present

## 2016-02-09 DIAGNOSIS — E785 Hyperlipidemia, unspecified: Secondary | ICD-10-CM | POA: Insufficient documentation

## 2016-02-09 DIAGNOSIS — Z8719 Personal history of other diseases of the digestive system: Secondary | ICD-10-CM | POA: Diagnosis not present

## 2016-02-09 DIAGNOSIS — Z7902 Long term (current) use of antithrombotics/antiplatelets: Secondary | ICD-10-CM | POA: Diagnosis not present

## 2016-02-09 DIAGNOSIS — F329 Major depressive disorder, single episode, unspecified: Secondary | ICD-10-CM | POA: Diagnosis not present

## 2016-02-09 DIAGNOSIS — Z8546 Personal history of malignant neoplasm of prostate: Secondary | ICD-10-CM | POA: Diagnosis not present

## 2016-02-09 LAB — CBC WITH DIFFERENTIAL/PLATELET
BASOS PCT: 0 %
Basophils Absolute: 0 10*3/uL (ref 0.0–0.1)
EOS ABS: 0.5 10*3/uL (ref 0.0–0.7)
EOS PCT: 7 %
HEMATOCRIT: 39.9 % (ref 39.0–52.0)
HEMOGLOBIN: 13.7 g/dL (ref 13.0–17.0)
LYMPHS ABS: 1.8 10*3/uL (ref 0.7–4.0)
Lymphocytes Relative: 26 %
MCH: 31.4 pg (ref 26.0–34.0)
MCHC: 34.3 g/dL (ref 30.0–36.0)
MCV: 91.5 fL (ref 78.0–100.0)
MONOS PCT: 8 %
Monocytes Absolute: 0.6 10*3/uL (ref 0.1–1.0)
Neutro Abs: 4.1 10*3/uL (ref 1.7–7.7)
Neutrophils Relative %: 59 %
Platelets: 180 10*3/uL (ref 150–400)
RBC: 4.36 MIL/uL (ref 4.22–5.81)
RDW: 12 % (ref 11.5–15.5)
WBC: 7 10*3/uL (ref 4.0–10.5)

## 2016-02-09 LAB — URINALYSIS, ROUTINE W REFLEX MICROSCOPIC
Bilirubin Urine: NEGATIVE
GLUCOSE, UA: NEGATIVE mg/dL
Hgb urine dipstick: NEGATIVE
KETONES UR: NEGATIVE mg/dL
LEUKOCYTES UA: NEGATIVE
NITRITE: NEGATIVE
Protein, ur: NEGATIVE mg/dL
Specific Gravity, Urine: 1.023 (ref 1.005–1.030)
pH: 6 (ref 5.0–8.0)

## 2016-02-09 LAB — COMPREHENSIVE METABOLIC PANEL
ALK PHOS: 50 U/L (ref 38–126)
ALT: 33 U/L (ref 17–63)
ANION GAP: 9 (ref 5–15)
AST: 28 U/L (ref 15–41)
Albumin: 4.5 g/dL (ref 3.5–5.0)
BILIRUBIN TOTAL: 0.9 mg/dL (ref 0.3–1.2)
BUN: 18 mg/dL (ref 6–20)
CALCIUM: 9 mg/dL (ref 8.9–10.3)
CO2: 27 mmol/L (ref 22–32)
Chloride: 103 mmol/L (ref 101–111)
Creatinine, Ser: 0.77 mg/dL (ref 0.61–1.24)
GFR calc non Af Amer: >60 mL/min (ref >60)
GLUCOSE: 110 mg/dL — AB (ref 65–99)
Potassium: 3.8 mmol/L (ref 3.5–5.1)
Sodium: 139 mmol/L (ref 135–145)
TOTAL PROTEIN: 7.6 g/dL (ref 6.5–8.1)

## 2016-02-09 MED ORDER — SODIUM CHLORIDE 0.9 % IV BOLUS (SEPSIS)
1000.0000 mL | Freq: Once | INTRAVENOUS | Status: AC
Start: 2016-02-09 — End: 2016-02-09
  Administered 2016-02-09: 1000 mL via INTRAVENOUS

## 2016-02-09 NOTE — ED Provider Notes (Signed)
CSN: [id number]     Arrival date & time 02/09/16  1737 History  By signing my name below, I, Irene Pap, attest that this documentation has been prepared under the direction and in the presence of Wandra Arthurs, MD. Electronically Signed: Irene Pap, ED Scribe. 02/09/2016. 6:18 PM.   Chief Complaint  Patient presents with  . Altered Mental Status   The history is provided by the spouse. No language interpreter was used.  HPI Comments (Level 5 Caveat due to dementia): Samuel Meyers is a 82 y.o. Male with a hx of CAD, dementia, PE, stroke, prostate cancer, and gastroenteritis brought in by EMS from [facility] who presents to the Emergency Department complaining of altered mental status onset PTA. Wife states that the home health care worker came to their house today and the patient was unhappy about his presence. She states that he has been acting "odd" and defensive against the security guard. Wife reports that she has been taking care of the pt but is unable to due to sciatic nerve pain. She reports that they are thinking of putting the pt into skilled nursing area, but the pt refuses to go there. Pt recently finished a course of keflex for a UTI last week.. She reports that he has also been refusing UA, and [facility] sent the pt to the ED. She states that the pt has not been eating or drinking as well in the past three days.     Past Medical History  Diagnosis Date  . Hypertension   . Hyperlipidemia   . CAD (coronary artery disease)   . Pulmonary embolus (HCC)     Time of CABG  . Dementia   . Depression   . Stroke ([place])     2013--Sept  . Gastroenteritis 10/20/2013  . Anemia 03/01/2014  . Prostate cancer ([place])   . Renal cyst, left 08/27/2014    5x6 cm on Ultrasound September 2015  . Medicare annual wellness visit, subsequent 09/08/2015   Past Surgical History  Procedure Laterality Date  . Coronary artery bypass graft      [place]  . External ear  surgery    . Tonsillectomy    . Tee without cardioversion  08/15/2012    Procedure: TRANSESOPHAGEAL ECHOCARDIOGRAM (TEE);  Surgeon: Fay Records, MD;  Location: [facility] ENDOSCOPY;  Service: Cardiovascular;  Laterality: N/A;   Family History  Problem Relation Age of Onset  . Coronary artery disease Father     MI in his 66s  . Heart disease Father   . Early death Father   . Heart disease Mother   . Stroke Mother     some associated memory loss  . Cancer Sister     brain  . Alcohol abuse Daughter   . Heart disease Brother     s/p open heart  . Diabetes Brother   . Stroke Brother   . Cancer Brother     pancreas   Social History  Substance Use Topics  . Smoking status: Never Smoker   . Smokeless tobacco: Never Used  . Alcohol Use: No    Review of Systems  Unable to perform ROS: Dementia   Allergies  Niacin and related and Wellbutrin  Home Medications   Prior to Admission medications   Medication Sig Start Date End Date Taking? Authorizing Provider  acetaminophen (TYLENOL) 650 MG CR tablet Take 1,300 mg by mouth daily.    Historical Provider, MD  atorvastatin (LIPITOR) 20 MG  tablet TAKE 1 TABLET DAILY 01/31/16   Mosie Lukes, MD  bimatoprost (LUMIGAN) 0.03 % ophthalmic solution Place 1 drop into both eyes at bedtime.    Historical Provider, MD  Carboxymethylcellulose Sodium (REFRESH TEARS OP) Apply 1 drop to eye daily as needed (for dry eyes).     Historical Provider, MD  carvedilol (COREG) 6.25 MG tablet TAKE 1 TABLET DAILY 08/06/15   Mosie Lukes, MD  cephALEXin (KEFLEX) 500 MG capsule Take 1 capsule (500 mg total) by mouth 4 (four) times daily. Patient taking differently: Take 500 mg by mouth 4 (four) times daily.  01/19/16   Ezequiel Essex, MD  Cholecalciferol 2000 units CAPS Take 2,000 Units by mouth daily.    Historical Provider, MD  clopidogrel (PLAVIX) 75 MG tablet TAKE 1 TABLET DAILY WITH BREAKFAST 12/02/15   Midge Minium, MD  Coenzyme Q10 (CO Q-10) 100 MG CAPS  Take 300 mg by mouth daily.    Historical Provider, MD  Ferrous Fumarate-Folic Acid (HEMOCYTE-F) 324-1 MG TABS Take 1 tablet by mouth daily. 07/19/15   Mosie Lukes, MD  finasteride (PROSCAR) 5 MG tablet Take 5 mg by mouth daily.    Historical Provider, MD  isosorbide mononitrate (IMDUR) 30 MG 24 hr tablet TAKE 1 TABLET DAILY 01/31/16   Mosie Lukes, MD  lisinopril (PRINIVIL,ZESTRIL) 10 MG tablet Take 1 tablet (10 mg total) by mouth daily. 01/20/16   Mosie Lukes, MD  Methylcobalamin (B-12) 5000 MCG TBDP Take 5,000 Units by mouth daily.    Historical Provider, MD  Misc Natural Products (OSTEO BI-FLEX JOINT SHIELD PO) Take 2 tablets by mouth daily.     Historical Provider, MD  Multiple Vitamin (MULTIVITAMIN) capsule Take 1 capsule by mouth daily.    Historical Provider, MD  Omega-3 Fatty Acids (FP FISH OIL PO) Take 2,400 mg by mouth daily.     Historical Provider, MD  Probiotic Product (PROBIOTIC DAILY PO) Take 1 capsule by mouth daily.     Historical Provider, MD  timolol (BETIMOL) 0.25 % ophthalmic solution Place 1-2 drops into both eyes 2 (two) times daily.    Historical Provider, MD  vitamin C (ASCORBIC ACID) 500 MG tablet Take 500 mg by mouth daily.    Historical Provider, MD  vitamin E 400 UNIT capsule Take 400 Units by mouth daily.    Historical Provider, MD   BP 179/93 mmHg  Pulse 70  Temp(Src) 97.5 F (36.4 C) (Oral)  Resp 18  Ht 5\' 10"  (1.778 m)  Wt 182 lb (82.555 kg)  BMI 26.11 kg/m2  SpO2 99% Physical Exam  Constitutional: He appears well-developed and well-nourished.  Chronically ill, confused   HENT:  Head: Normocephalic and atraumatic.  Eyes: EOM are normal. Pupils are equal, round, and reactive to light.  Neck: Normal range of motion. Neck supple.  Cardiovascular: Normal rate, regular rhythm and normal heart sounds.  Exam reveals no gallop and no friction rub.   No murmur heard. Pulmonary/Chest: Effort normal and breath sounds normal. He has no wheezes.  Abdominal:  Soft. There is no tenderness.  Musculoskeletal: Normal range of motion.  Neurological: He is alert.  Expressive aphasia that is chronic; confused; moving all extremities  Skin: Skin is warm and dry.  Nursing note and vitals reviewed.   ED Course  Procedures (including critical care time) DIAGNOSTIC STUDIES: Oxygen Saturation is 99% on RA, normal by my interpretation.    COORDINATION OF CARE: 6:18 PM-Discussed treatment plan which includes labs and IV  fluids with wife at bedside and wife agreed to plan.    Labs Review Labs Reviewed  COMPREHENSIVE METABOLIC PANEL - Abnormal; Notable for the following:    Glucose, Bld 110 (*)    All other components within normal limits  URINE CULTURE  CBC WITH DIFFERENTIAL/PLATELET  URINALYSIS, ROUTINE W REFLEX MICROSCOPIC (NOT AT [facility])    Imaging Review No results found. I have personally reviewed and evaluated these lab results as part of my medical decision-making.   EKG Interpretation None      MDM   Final diagnoses:  None   Samuel Meyers is a 82 y.o. male here with agitation, confusion. Hx of dementia so confusion is baseline. Had some agitation today. Calm in the ED. Has baseline expressive aphasia that is not worsening. Had CT head a month ago that was unremarkable and has no new neuro deficits. Will check labs, UA.   7:57 PM  Labs and UA unremarkable. Agitation likely from dementia. No signs of another stroke. Family will be able to get him to the skilled nursing facility within the retirement community.     I personally performed the services described in this documentation, which was scribed in my presence. The recorded information has been reviewed and is accurate.    Wandra Arthurs, MD 02/09/16 [place]

## 2016-02-09 NOTE — ED Notes (Signed)
MD at bedside. 

## 2016-02-09 NOTE — Discharge Instructions (Signed)
Continue your current meds.   You will benefit to be in skilled nursing or memory care unit.   See your doctor.   Return to ER if he is more agitated, more confused than usual, falls

## 2016-02-09 NOTE — Telephone Encounter (Signed)
Received call from RN at [facility]. Patient with mild decline in baseline dementia, which he has had with onset of UTI previously. Requesting order for UA and movement to SNF portion of [facility] for nursing assessments and management.  Verbal order for UA and culture given. FL2 printed and faxed to RN for movement to SNF.  They have been instructed to call as soon as labs result.  If anything is worsening they are to have MD there assess or send patient to ER via 911.  FYI Dr. Charlett Blake

## 2016-02-09 NOTE — ED Notes (Signed)
Per EMS:   Pt from [facility].  Pt having increased in confusion/combativeness.  Staff questioning UTI.

## 2016-02-09 NOTE — ED Notes (Signed)
Wife states patient recently had UTI and was treated with antibiotics which were completed last week.

## 2016-02-09 NOTE — Telephone Encounter (Signed)
Received Therapy Clarification Order; forwarded to provider/SLS 03/08

## 2016-02-11 LAB — URINE CULTURE: CULTURE: NO GROWTH

## 2016-02-28 ENCOUNTER — Other Ambulatory Visit: Payer: Self-pay | Admitting: Family Medicine

## 2016-03-28 ENCOUNTER — Ambulatory Visit (INDEPENDENT_AMBULATORY_CARE_PROVIDER_SITE_OTHER): Payer: Medicare Other | Admitting: Family Medicine

## 2016-03-28 ENCOUNTER — Encounter: Payer: Self-pay | Admitting: Family Medicine

## 2016-03-28 VITALS — BP 122/84 | HR 68 | Temp 98.5°F | Ht 70.0 in | Wt 178.2 lb

## 2016-03-28 DIAGNOSIS — R739 Hyperglycemia, unspecified: Secondary | ICD-10-CM

## 2016-03-28 DIAGNOSIS — I10 Essential (primary) hypertension: Secondary | ICD-10-CM | POA: Diagnosis not present

## 2016-03-28 DIAGNOSIS — R350 Frequency of micturition: Secondary | ICD-10-CM

## 2016-03-28 DIAGNOSIS — F0391 Unspecified dementia with behavioral disturbance: Secondary | ICD-10-CM

## 2016-03-28 NOTE — Patient Instructions (Signed)

## 2016-03-28 NOTE — Progress Notes (Signed)
Pre visit review using our clinic review tool, if applicable. No additional management support is needed unless otherwise documented below in the visit note. 

## 2016-03-28 NOTE — Progress Notes (Signed)
Subjective:    Patient ID: Samuel Meyers, male    DOB: 1934-06-11, 82 y.o.   MRN: [id number]  Chief Complaint  Patient presents with  . Follow-up    HPI Patient is in today for follow up. Patients daughter reports there has been an increase in nighttime hallucinations.  Family has been struggle visiting him because when they try to leave he gets upset.  Wife also reports begining to have trouble with mobility during meals and feeding himself.  Patient struggles with speech, seem the dementia is progressing.  Denies CP/palp/SOB/HA/congestion/fevers/GI or GU c/o. Taking meds as prescribed.   Past Medical History  Diagnosis Date  . Hypertension   . Hyperlipidemia   . CAD (coronary artery disease)   . Pulmonary embolus (HCC)     Time of CABG  . Dementia   . Depression   . Stroke ([place])     2013--Sept  . Gastroenteritis 10/20/2013  . Anemia 03/01/2014  . Prostate cancer ([place])   . Renal cyst, left 08/27/2014    5x6 cm on Ultrasound September 2015  . Medicare annual wellness visit, subsequent 09/08/2015    Past Surgical History  Procedure Laterality Date  . Coronary artery bypass graft      [place]  . External ear surgery    . Tonsillectomy    . Tee without cardioversion  08/15/2012    Procedure: TRANSESOPHAGEAL ECHOCARDIOGRAM (TEE);  Surgeon: Fay Records, MD;  Location: [facility] ENDOSCOPY;  Service: Cardiovascular;  Laterality: N/A;    Family History  Problem Relation Age of Onset  . Coronary artery disease Father     MI in his 56s  . Heart disease Father   . Early death Father   . Heart disease Mother   . Stroke Mother     some associated memory loss  . Cancer Sister     brain  . Alcohol abuse Daughter   . Heart disease Brother     s/p open heart  . Diabetes Brother   . Stroke Brother   . Cancer Brother     pancreas    Social History   Social History  . Marital Status: Married    Spouse Name: N/A  . Number of Children: 1  . Years of Education: N/A    Occupational History  . security     Retired   Social History Main Topics  . Smoking status: Never Smoker   . Smokeless tobacco: Never Used  . Alcohol Use: No  . Drug Use: No  . Sexual Activity: No     Comment: lives [place] with wife, no dietary restrictions   Other Topics Concern  . Not on file   Social History Narrative    Outpatient Prescriptions Prior to Visit  Medication Sig Dispense Refill  . acetaminophen (TYLENOL) 650 MG CR tablet Take 1,300 mg by mouth daily.    Marland Kitchen atorvastatin (LIPITOR) 20 MG tablet TAKE 1 TABLET DAILY 90 tablet 2  . bimatoprost (LUMIGAN) 0.03 % ophthalmic solution Place 1 drop into both eyes at bedtime.    . Carboxymethylcellulose Sodium (REFRESH TEARS OP) Apply 1 drop to eye daily as needed (for dry eyes).     . carvedilol (COREG) 6.25 MG tablet TAKE 1 TABLET DAILY 90 tablet 2  . Cholecalciferol 2000 units CAPS Take 2,000 Units by mouth daily.    . clopidogrel (PLAVIX) 75 MG tablet TAKE 1 TABLET DAILY WITH BREAKFAST 90 tablet 0  . finasteride (PROSCAR) 5 MG  tablet Take 5 mg by mouth daily.    . isosorbide mononitrate (IMDUR) 30 MG 24 hr tablet TAKE 1 TABLET DAILY 90 tablet 0  . lisinopril (PRINIVIL,ZESTRIL) 10 MG tablet Take 1 tablet (10 mg total) by mouth daily. 90 tablet 3  . Methylcobalamin (B-12) 5000 MCG TBDP Take 5,000 Units by mouth daily.    . Probiotic Product (PROBIOTIC DAILY PO) Take 1 capsule by mouth daily.     . timolol (BETIMOL) 0.25 % ophthalmic solution Place 1-2 drops into both eyes 2 (two) times daily.    . cephALEXin (KEFLEX) 500 MG capsule Take 1 capsule (500 mg total) by mouth 4 (four) times daily. (Patient taking differently: Take 500 mg by mouth 4 (four) times daily. ) 40 capsule 0  . Coenzyme Q10 (CO Q-10) 100 MG CAPS Take 300 mg by mouth daily.    . Ferrous Fumarate-Folic Acid (HEMOCYTE-F) 324-1 MG TABS Take 1 tablet by mouth daily. 90 each 1  . Misc Natural Products (OSTEO BI-FLEX JOINT SHIELD PO) Take 2 tablets  by mouth daily.     . Multiple Vitamin (MULTIVITAMIN) capsule Take 1 capsule by mouth daily.    . Omega-3 Fatty Acids (FP FISH OIL PO) Take 2,400 mg by mouth daily.     . vitamin C (ASCORBIC ACID) 500 MG tablet Take 500 mg by mouth daily.    . vitamin E 400 UNIT capsule Take 400 Units by mouth daily.     No facility-administered medications prior to visit.    Allergies  Allergen Reactions  . Niacin And Related Nausea And Vomiting  . Wellbutrin [Bupropion] Hives and Other (See Comments)    Other reaction(s): GI Upset (intolerance)     Review of Systems  Constitutional: Negative for fever and malaise/fatigue.  HENT: Negative for congestion.   Eyes: Negative for blurred vision.  Respiratory: Negative for shortness of breath.   Cardiovascular: Negative for chest pain, palpitations and leg swelling.  Gastrointestinal: Negative for nausea, abdominal pain and blood in stool.  Genitourinary: Negative for dysuria and frequency.  Musculoskeletal: Negative for falls.  Skin: Negative for rash.  Neurological: Negative for dizziness, loss of consciousness and headaches.  Endo/Heme/Allergies: Negative for environmental allergies.  Psychiatric/Behavioral: Negative for depression. The patient is not nervous/anxious.        Objective:    Physical Exam  Constitutional: He is oriented to person, place, and time. He appears well-developed and well-nourished. No distress.  HENT:  Head: Normocephalic and atraumatic.  Eyes: Conjunctivae are normal.  Neck: Neck supple. No thyromegaly present.  Cardiovascular: Normal rate, regular rhythm and normal heart sounds.   No murmur heard. Pulmonary/Chest: Effort normal and breath sounds normal. No respiratory distress. He has no wheezes.  Abdominal: Soft. Bowel sounds are normal. He exhibits no mass. There is no tenderness.  Musculoskeletal: He exhibits no edema.  Lymphadenopathy:    He has no cervical adenopathy.  Neurological: He is alert and  oriented to person, place, and time.  Skin: Skin is warm and dry.  Psychiatric: He has a normal mood and affect. His behavior is normal.    BP 122/84 mmHg  Pulse 68  Temp(Src) 98.5 F (36.9 C) (Oral)  Ht 5\' 10"  (1.778 m)  Wt 178 lb 4 oz (80.854 kg)  BMI 25.58 kg/m2  SpO2 99% Wt Readings from Last 3 Encounters:  03/28/16 178 lb 4 oz (80.854 kg)  02/09/16 182 lb (82.555 kg)  01/24/16 178 lb 9.6 oz (81.012 kg)     Lab  Results  Component Value Date   WBC 7.0 02/09/2016   HGB 13.7 02/09/2016   HCT 39.9 02/09/2016   PLT 180 02/09/2016   GLUCOSE 110* 02/09/2016   CHOL 91 08/31/2015   TRIG 63.0 08/31/2015   HDL 44.00 08/31/2015   LDLCALC 34 08/31/2015   ALT 33 02/09/2016   AST 28 02/09/2016   NA 139 02/09/2016   K 3.8 02/09/2016   CL 103 02/09/2016   CREATININE 0.77 02/09/2016   BUN 18 02/09/2016   CO2 27 02/09/2016   TSH 1.04 08/31/2015   INR 1.13 01/19/2016   HGBA1C 6.1 11/30/2014    Lab Results  Component Value Date   TSH 1.04 08/31/2015   Lab Results  Component Value Date   WBC 7.0 02/09/2016   HGB 13.7 02/09/2016   HCT 39.9 02/09/2016   MCV 91.5 02/09/2016   PLT 180 02/09/2016   Lab Results  Component Value Date   NA 139 02/09/2016   K 3.8 02/09/2016   CO2 27 02/09/2016   GLUCOSE 110* 02/09/2016   BUN 18 02/09/2016   CREATININE 0.77 02/09/2016   BILITOT 0.9 02/09/2016   ALKPHOS 50 02/09/2016   AST 28 02/09/2016   ALT 33 02/09/2016   PROT 7.6 02/09/2016   ALBUMIN 4.5 02/09/2016   CALCIUM 9.0 02/09/2016   ANIONGAP 9 02/09/2016   GFR 97.62 08/31/2015   Lab Results  Component Value Date   CHOL 91 08/31/2015   Lab Results  Component Value Date   HDL 44.00 08/31/2015   Lab Results  Component Value Date   LDLCALC 34 08/31/2015   Lab Results  Component Value Date   TRIG 63.0 08/31/2015   Lab Results  Component Value Date   CHOLHDL 2 08/31/2015   Lab Results  Component Value Date   HGBA1C 6.1 11/30/2014       Assessment & Plan:    Problem List Items Addressed This Visit    Hypertension    Well controlled, no changes to meds. Encouraged heart healthy diet such as the DASH diet and exercise as tolerated.       Hyperglycemia    hgba1c acceptable, minimize simple carbs. Increase exercise as tolerated.       Dementia (Chronic)    Has worsened significantly. They have had to change his level of care. No significant agitation most of the time. If he clinically worsens they agree to call neurology for further consideration.        Other Visit Diagnoses    Urinary frequency    -  Primary    Relevant Orders    Urinalysis (Completed)    Urine culture (Completed)       I have discontinued Mr. Canedy Misc Natural Products (OSTEO BI-FLEX JOINT SHIELD PO), Omega-3 Fatty Acids (FP FISH OIL PO), multivitamin, vitamin C, Co Q-10, Ferrous Fumarate-Folic Acid, vitamin E, and cephALEXin. I am also having him maintain his finasteride, acetaminophen, timolol, bimatoprost, carvedilol, Probiotic Product (PROBIOTIC DAILY PO), Carboxymethylcellulose Sodium (REFRESH TEARS OP), Cholecalciferol, B-12, lisinopril, atorvastatin, isosorbide mononitrate, and clopidogrel.  No orders of the defined types were placed in this encounter.     Penni Homans, MD

## 2016-03-29 LAB — URINALYSIS
Bilirubin Urine: NEGATIVE
Hgb urine dipstick: NEGATIVE
KETONES UR: NEGATIVE
LEUKOCYTES UA: NEGATIVE
Nitrite: NEGATIVE
PH: 6 (ref 5.0–8.0)
SPECIFIC GRAVITY, URINE: 1.02 (ref 1.000–1.030)
Total Protein, Urine: NEGATIVE
URINE GLUCOSE: NEGATIVE
UROBILINOGEN UA: 0.2 (ref 0.0–1.0)

## 2016-03-30 LAB — URINE CULTURE
COLONY COUNT: NO GROWTH
Organism ID, Bacteria: NO GROWTH

## 2016-04-10 NOTE — Assessment & Plan Note (Signed)
Well controlled, no changes to meds. Encouraged heart healthy diet such as the DASH diet and exercise as tolerated.  °

## 2016-04-10 NOTE — Assessment & Plan Note (Signed)
hgba1c acceptable, minimize simple carbs. Increase exercise as tolerated.  

## 2016-04-10 NOTE — Assessment & Plan Note (Signed)
Has worsened significantly. They have had to change his level of care. No significant agitation most of the time. If he clinically worsens they agree to call neurology for further consideration.

## 2016-04-28 ENCOUNTER — Other Ambulatory Visit: Payer: Self-pay | Admitting: Family Medicine

## 2016-05-10 ENCOUNTER — Telehealth: Payer: Self-pay | Admitting: Family Medicine

## 2016-05-10 NOTE — Telephone Encounter (Signed)
Please remove me as PCP

## 2016-05-10 NOTE — Telephone Encounter (Signed)
Pt's spouse called in to inform PCP that pt is currently in a nursing facility. She says that the location is making the pt take on a new PCP at the facility. Spouse says that they have no choice due to him being at location and she doesn't have a car to continue to transport pt to see Dr. B.    She also says that we no longer need to sent Rx for pt. ([facility])

## 2016-05-28 ENCOUNTER — Other Ambulatory Visit: Payer: Self-pay | Admitting: Family Medicine

## 2016-05-28 NOTE — Telephone Encounter (Signed)
I believe he is no longer our patient he uses the facility doctor. Please confirm and let pharmacy know

## 2016-06-28 NOTE — Telephone Encounter (Signed)
FYI, Dr. Charlett Blake.

## 2016-06-28 NOTE — Telephone Encounter (Signed)
Pt's daughter called in to inform provider that pt has passed away. He had a bleed on his brain.

## 2016-07-02 NOTE — Telephone Encounter (Signed)
Please send them a condolence card, to daughter and wife.

## 2016-07-03 NOTE — Telephone Encounter (Signed)
Card on desk

## 2016-07-04 DEATH — deceased

## 2016-07-27 ENCOUNTER — Other Ambulatory Visit: Payer: Self-pay | Admitting: Family Medicine

## 2016-10-03 ENCOUNTER — Encounter: Payer: Medicare Other | Admitting: Family Medicine
